# Patient Record
Sex: Female | Born: 1970 | Race: White | Hispanic: No | Smoking: Never smoker
Health system: Southern US, Community
[De-identification: ages and names within clinical notes are randomized; demographics above are authoritative.]

## PROBLEM LIST (undated history)

## (undated) DIAGNOSIS — N189 Chronic kidney disease, unspecified: Secondary | ICD-10-CM

## (undated) DIAGNOSIS — B029 Zoster without complications: Secondary | ICD-10-CM

## (undated) DIAGNOSIS — N83209 Unspecified ovarian cyst, unspecified side: Secondary | ICD-10-CM

## (undated) DIAGNOSIS — F32A Depression, unspecified: Secondary | ICD-10-CM

## (undated) DIAGNOSIS — F329 Major depressive disorder, single episode, unspecified: Secondary | ICD-10-CM

## (undated) DIAGNOSIS — IMO0002 Reserved for concepts with insufficient information to code with codable children: Secondary | ICD-10-CM

## (undated) DIAGNOSIS — T7840XA Allergy, unspecified, initial encounter: Secondary | ICD-10-CM

## (undated) DIAGNOSIS — I2699 Other pulmonary embolism without acute cor pulmonale: Secondary | ICD-10-CM

## (undated) DIAGNOSIS — F419 Anxiety disorder, unspecified: Secondary | ICD-10-CM

## (undated) DIAGNOSIS — G43709 Chronic migraine without aura, not intractable, without status migrainosus: Secondary | ICD-10-CM

## (undated) DIAGNOSIS — E039 Hypothyroidism, unspecified: Secondary | ICD-10-CM

## (undated) HISTORY — PX: WISDOM TOOTH EXTRACTION: SHX21

## (undated) HISTORY — DX: Unspecified ovarian cyst, unspecified side: N83.209

## (undated) HISTORY — DX: Other pulmonary embolism without acute cor pulmonale: I26.99

## (undated) HISTORY — PX: OTHER SURGICAL HISTORY: SHX169

## (undated) HISTORY — PX: BREAST SURGERY: SHX581

## (undated) HISTORY — DX: Depression, unspecified: F32.A

## (undated) HISTORY — DX: Major depressive disorder, single episode, unspecified: F32.9

## (undated) HISTORY — DX: Allergy, unspecified, initial encounter: T78.40XA

## (undated) HISTORY — PX: BREAST ENHANCEMENT SURGERY: SHX7

## (undated) HISTORY — DX: Zoster without complications: B02.9

## (undated) HISTORY — PX: FOOT SURGERY: SHX648

## (undated) HISTORY — DX: Anxiety disorder, unspecified: F41.9

---

## 1998-05-14 ENCOUNTER — Other Ambulatory Visit: Admission: RE | Admit: 1998-05-14 | Discharge: 1998-05-14 | Payer: Self-pay | Admitting: Obstetrics and Gynecology

## 1999-06-19 ENCOUNTER — Other Ambulatory Visit: Admission: RE | Admit: 1999-06-19 | Discharge: 1999-06-19 | Payer: Self-pay | Admitting: Internal Medicine

## 1999-06-25 ENCOUNTER — Emergency Department (HOSPITAL_COMMUNITY): Admission: EM | Admit: 1999-06-25 | Discharge: 1999-06-25 | Payer: Self-pay | Admitting: Emergency Medicine

## 2000-10-11 ENCOUNTER — Other Ambulatory Visit: Admission: RE | Admit: 2000-10-11 | Discharge: 2000-10-11 | Payer: Self-pay | Admitting: Obstetrics and Gynecology

## 2001-11-02 ENCOUNTER — Encounter: Payer: Self-pay | Admitting: Obstetrics and Gynecology

## 2001-11-02 ENCOUNTER — Ambulatory Visit (HOSPITAL_COMMUNITY): Admission: RE | Admit: 2001-11-02 | Discharge: 2001-11-02 | Payer: Self-pay | Admitting: Obstetrics and Gynecology

## 2002-04-18 ENCOUNTER — Inpatient Hospital Stay (HOSPITAL_COMMUNITY): Admission: AD | Admit: 2002-04-18 | Discharge: 2002-04-20 | Payer: Self-pay | Admitting: Obstetrics and Gynecology

## 2002-04-23 ENCOUNTER — Inpatient Hospital Stay (HOSPITAL_COMMUNITY): Admission: AD | Admit: 2002-04-23 | Discharge: 2002-04-23 | Payer: Self-pay | Admitting: Obstetrics and Gynecology

## 2002-04-23 ENCOUNTER — Encounter: Payer: Self-pay | Admitting: Obstetrics and Gynecology

## 2002-05-31 ENCOUNTER — Other Ambulatory Visit: Admission: RE | Admit: 2002-05-31 | Discharge: 2002-05-31 | Payer: Self-pay | Admitting: Obstetrics and Gynecology

## 2003-08-09 ENCOUNTER — Other Ambulatory Visit: Admission: RE | Admit: 2003-08-09 | Discharge: 2003-08-09 | Payer: Self-pay | Admitting: Obstetrics and Gynecology

## 2004-08-24 ENCOUNTER — Other Ambulatory Visit: Admission: RE | Admit: 2004-08-24 | Discharge: 2004-08-24 | Payer: Self-pay | Admitting: Obstetrics and Gynecology

## 2004-11-26 ENCOUNTER — Ambulatory Visit: Payer: Self-pay | Admitting: Internal Medicine

## 2005-02-22 ENCOUNTER — Ambulatory Visit: Payer: Self-pay | Admitting: Internal Medicine

## 2005-09-10 ENCOUNTER — Other Ambulatory Visit: Admission: RE | Admit: 2005-09-10 | Discharge: 2005-09-10 | Payer: Self-pay | Admitting: Obstetrics and Gynecology

## 2006-04-02 ENCOUNTER — Emergency Department (HOSPITAL_COMMUNITY): Admission: EM | Admit: 2006-04-02 | Discharge: 2006-04-02 | Payer: Self-pay | Admitting: Emergency Medicine

## 2006-07-06 ENCOUNTER — Ambulatory Visit: Payer: Self-pay | Admitting: Internal Medicine

## 2006-11-04 ENCOUNTER — Ambulatory Visit: Payer: Self-pay | Admitting: Internal Medicine

## 2007-02-16 ENCOUNTER — Ambulatory Visit: Payer: Self-pay | Admitting: Internal Medicine

## 2007-04-13 ENCOUNTER — Ambulatory Visit: Payer: Self-pay | Admitting: Internal Medicine

## 2007-09-22 ENCOUNTER — Telehealth: Payer: Self-pay | Admitting: Internal Medicine

## 2007-09-22 ENCOUNTER — Ambulatory Visit: Payer: Self-pay | Admitting: Internal Medicine

## 2008-04-08 ENCOUNTER — Telehealth: Payer: Self-pay | Admitting: *Deleted

## 2008-11-29 ENCOUNTER — Telehealth: Payer: Self-pay | Admitting: Internal Medicine

## 2008-11-30 ENCOUNTER — Ambulatory Visit: Payer: Self-pay | Admitting: Family Medicine

## 2008-11-30 DIAGNOSIS — J069 Acute upper respiratory infection, unspecified: Secondary | ICD-10-CM | POA: Insufficient documentation

## 2009-02-12 ENCOUNTER — Telehealth: Payer: Self-pay | Admitting: *Deleted

## 2009-02-12 ENCOUNTER — Telehealth: Payer: Self-pay | Admitting: Internal Medicine

## 2009-02-14 ENCOUNTER — Telehealth: Payer: Self-pay | Admitting: Internal Medicine

## 2009-02-18 ENCOUNTER — Telehealth: Payer: Self-pay | Admitting: Internal Medicine

## 2009-09-26 ENCOUNTER — Ambulatory Visit: Payer: Self-pay | Admitting: Family Medicine

## 2009-09-26 DIAGNOSIS — B029 Zoster without complications: Secondary | ICD-10-CM | POA: Insufficient documentation

## 2009-09-26 HISTORY — DX: Zoster without complications: B02.9

## 2009-09-29 ENCOUNTER — Telehealth: Payer: Self-pay | Admitting: *Deleted

## 2010-10-15 ENCOUNTER — Encounter: Payer: Self-pay | Admitting: Internal Medicine

## 2011-01-28 NOTE — Consult Note (Signed)
Summary: [facility]   Imported By: Lennie Odor 12/16/2010 15:01:23  _____________________________________________________________________  External Attachment:    Type:   Image     Comment:   External Document

## 2011-05-14 NOTE — H&P (Signed)
[facility]  Patient:    Desiree Bauer, Desiree Bauer Visit Number: [id number] MRN: [id number]          Service Type: MED Location: [facility] Attending Physician:  Cordelia Pen Ii Dictated by:   Duke Salvia. Marcelle Overlie, M.D. Admit Date:  04/23/2002                           History and Physical  HISTORY:                      A 40 year old G2, P75, EDD April 28.  She is 39 2/7 weeks.  Presents to the office today complaining of early labor.  Was 3, 80%, vertex.  Is admitted now in labor.  Group B Strep screen was negative. One hour GTT was 100.  Blood type B+.  Rubella titer is positive.  ALLERGIES:                    PENICILLIN, ERYTHROMYCIN, SULFA.  PAST OBSTETRICAL HISTORY:     One vaginal delivery in 1998 of 7 pound 10 ounce female.  REVIEW OF SYSTEMS:            Significant for a history of postpartum depression and migraine headache.  PHYSICAL EXAMINATION  VITAL SIGNS:                  Temperature 98.2, blood pressure 100/64.  HEENT:                        Unremarkable.  NECK:                         Supple without masses.  LUNGS:                        Clear.  CARDIOVASCULAR:               Regular rate and rhythm without murmurs, rubs, or gallops noted.  BREASTS:                      Not examined.  ABDOMEN:                      Term fundal height.  Fetal heart rate 140.  PELVIC:                       Cervix was 3, 80%, vertex, -1.  Membranes intact.  EXTREMITIES:                  Unremarkable.  NEUROLOGIC:                   Unremarkable.  IMPRESSION:                   Term intrauterine pregnancy, labor.  PLAN:                         Admit for AROM. Dictated by:   Duke Salvia. Marcelle Overlie, M.D. Attending Physician:  Soledad Gerlach DD:  04/18/02 TD:  04/18/02 Job: [phone] [id number]

## 2011-05-14 NOTE — Op Note (Signed)
[facility]  Patient:    Desiree Bauer, Desiree Bauer Visit Number: [id number] MRN: [id number]          Service Type: MED Location: [facility] Attending Physician:  Cordelia Pen Ii Dictated by:   Duke Salvia. Marcelle Overlie, M.D. Proc. Date: 04/18/02 Admit Date:  04/23/2002                             Operative Report  PREOPERATIVE DIAGNOSIS:  POSTOPERATIVE DIAGNOSIS:  OPERATION:  SURGEON:                      Duke Salvia. Marcelle Overlie, M.D.  ASSISTANT:  ANESTHESIA:  ESTIMATED BLOOD LOSS:  DESCRIPTION OF PROCEDURE:     This patient was noted to have a rapid progress and late first stage labor, increased bloody show, and was noted to be complete with some significant variable decellerations with good recovery. She was coached to push and with each pushing effort was noted to have deep variable with good recovery, but was fairly numb at that point and due to poor pushing effort, the decision was made to proceed with vacuum-assisted vaginal delivery.  At that point she was +3 and slightly LOA.  The Kiwi VE was applied.  One traction effort with maternal pushing to effect delivery of a female, Apgars 9 and 9.  There was a loose nuchal cord x1. Cord pH was sent and is pending.  A midline episiotomy with no extension.  The infant was doing well at that point.  The placenta delivered spontaneously intact.  There were no other lacerations noted.  Standard layered repair of the episiotomy with 3-0 Vicryl Rapide suture.  Mother and baby doing well at that point. Dictated by:   Duke Salvia. Marcelle Overlie, M.D. Attending Physician:  Soledad Gerlach DD:  04/18/02 TD:  04/18/02 Job: [phone] [id number]

## 2012-01-04 ENCOUNTER — Other Ambulatory Visit: Payer: Self-pay | Admitting: Obstetrics and Gynecology

## 2012-01-04 DIAGNOSIS — R928 Other abnormal and inconclusive findings on diagnostic imaging of breast: Secondary | ICD-10-CM

## 2012-01-11 ENCOUNTER — Ambulatory Visit
Admission: RE | Admit: 2012-01-11 | Discharge: 2012-01-11 | Disposition: A | Discharge status: Home / Self Care | Payer: 59 | Source: Ambulatory Visit | Attending: Obstetrics and Gynecology | Admitting: Obstetrics and Gynecology

## 2012-01-11 DIAGNOSIS — R928 Other abnormal and inconclusive findings on diagnostic imaging of breast: Secondary | ICD-10-CM

## 2013-05-19 ENCOUNTER — Ambulatory Visit (INDEPENDENT_AMBULATORY_CARE_PROVIDER_SITE_OTHER): Payer: 59 | Admitting: Emergency Medicine

## 2013-05-19 VITALS — BP 132/75 | HR 65 | Temp 98.0°F | Resp 16 | Ht 62.5 in | Wt 117.2 lb

## 2013-05-19 DIAGNOSIS — J309 Allergic rhinitis, unspecified: Secondary | ICD-10-CM

## 2013-05-19 DIAGNOSIS — N390 Urinary tract infection, site not specified: Secondary | ICD-10-CM

## 2013-05-19 DIAGNOSIS — N3000 Acute cystitis without hematuria: Secondary | ICD-10-CM

## 2013-05-19 LAB — POCT URINALYSIS DIPSTICK
Bilirubin, UA: NEGATIVE
Glucose, UA: NEGATIVE
Ketones, UA: NEGATIVE
Nitrite, UA: NEGATIVE
Protein, UA: NEGATIVE
Spec Grav, UA: <=1.005
Urobilinogen, UA: 0.2
pH, UA: 6.5

## 2013-05-19 LAB — POCT UA - MICROSCOPIC ONLY
Mucus, UA: NEGATIVE
RBC, urine, microscopic: NEGATIVE
Yeast, UA: NEGATIVE

## 2013-05-19 MED ORDER — CIPROFLOXACIN HCL 500 MG PO TABS: 500.0000 mg | ORAL_TABLET | Freq: Two times a day (BID) | ORAL | Status: DC

## 2013-05-19 MED ORDER — PHENAZOPYRIDINE HCL 200 MG PO TABS: 200.0000 mg | ORAL_TABLET | Freq: Three times a day (TID) | ORAL | Status: DC | PRN

## 2013-05-19 MED ORDER — FLUTICASONE PROPIONATE 50 MCG/ACT NA SUSP: 4.0000 | Freq: Every day | NASAL | Status: DC

## 2013-05-19 NOTE — Patient Instructions (Addendum)
Allergic Rhinitis Allergic rhinitis is when the mucous membranes in the nose respond to allergens. Allergens are particles in the air that cause your body to have an allergic reaction. This causes you to release allergic antibodies. Through a chain of events, these eventually cause you to release histamine into the blood stream (hence the use of antihistamines). Although meant to be protective to the body, it is this release that causes your discomfort, such as frequent sneezing, congestion and an itchy runny nose.  CAUSES  The pollen allergens may come from grasses, trees, and weeds. This is seasonal allergic rhinitis, or "hay fever." Other allergens cause year-round allergic rhinitis (perennial allergic rhinitis) such as house dust mite allergen, pet dander and mold spores.  SYMPTOMS   Nasal stuffiness (congestion).  Runny, itchy nose with sneezing and tearing of the eyes.  There is often an itching of the mouth, eyes and ears. It cannot be cured, but it can be controlled with medications. DIAGNOSIS  If you are unable to determine the offending allergen, skin or blood testing may find it. TREATMENT   Avoid the allergen.  Medications and allergy shots (immunotherapy) can help.  Hay fever may often be treated with antihistamines in pill or nasal spray forms. Antihistamines block the effects of histamine. There are over-the-counter medicines that may help with nasal congestion and swelling around the eyes. Check with your caregiver before taking or giving this medicine. If the treatment above does not work, there are many new medications your caregiver can prescribe. Stronger medications may be used if initial measures are ineffective. Desensitizing injections can be used if medications and avoidance fails. Desensitization is when a patient is given ongoing shots until the body becomes less sensitive to the allergen. Make sure you follow up with your caregiver if problems continue. SEEK MEDICAL  CARE IF:   You develop fever (more than 100.5 F (38.1 C).  You develop a cough that does not stop easily (persistent).  You have shortness of breath.  You start wheezing.  Symptoms interfere with normal daily activities. Document Released: 09/07/2001 Document Revised: 03/06/2012 Document Reviewed: 03/19/2009 [facility] Patient Information 2014 [place], [place]. Urinary Tract Infection Urinary tract infections (UTIs) can develop anywhere along your urinary tract. Your urinary tract is your body's drainage system for removing wastes and extra water. Your urinary tract includes two kidneys, two ureters, a bladder, and a urethra. Your kidneys are a pair of bean-shaped organs. Each kidney is about the size of your fist. They are located below your ribs, one on each side of your spine. CAUSES Infections are caused by microbes, which are microscopic organisms, including fungi, viruses, and bacteria. These organisms are so small that they can only be seen through a microscope. Bacteria are the microbes that most commonly cause UTIs. SYMPTOMS  Symptoms of UTIs may vary by age and gender of the patient and by the location of the infection. Symptoms in young women typically include a frequent and intense urge to urinate and a painful, burning feeling in the bladder or urethra during urination. Older women and men are more likely to be tired, shaky, and weak and have muscle aches and abdominal pain. A fever may mean the infection is in your kidneys. Other symptoms of a kidney infection include pain in your back or sides below the ribs, nausea, and vomiting. DIAGNOSIS To diagnose a UTI, your caregiver will ask you about your symptoms. Your caregiver also will ask to provide a urine sample. The urine sample will be  tested for bacteria and white blood cells. White blood cells are made by your body to help fight infection. TREATMENT  Typically, UTIs can be treated with medication. Because most UTIs are caused  by a bacterial infection, they usually can be treated with the use of antibiotics. The choice of antibiotic and length of treatment depend on your symptoms and the type of bacteria causing your infection. HOME CARE INSTRUCTIONS  If you were prescribed antibiotics, take them exactly as your caregiver instructs you. Finish the medication even if you feel better after you have only taken some of the medication.  Drink enough water and fluids to keep your urine clear or pale yellow.  Avoid caffeine, tea, and carbonated beverages. They tend to irritate your bladder.  Empty your bladder often. Avoid holding urine for long periods of time.  Empty your bladder before and after sexual intercourse.  After a bowel movement, women should cleanse from front to back. Use each tissue only once. SEEK MEDICAL CARE IF:   You have back pain.  You develop a fever.  Your symptoms do not begin to resolve within 3 days. SEEK IMMEDIATE MEDICAL CARE IF:   You have severe back pain or lower abdominal pain.  You develop chills.  You have nausea or vomiting.  You have continued burning or discomfort with urination. MAKE SURE YOU:   Understand these instructions.  Will watch your condition.  Will get help right away if you are not doing well or get worse. Document Released: 09/22/2005 Document Revised: 06/13/2012 Document Reviewed: 01/21/2012 [facility] Patient Information 2014 [place], [place].

## 2013-05-19 NOTE — Progress Notes (Signed)
[facility] [address], [place] [address] [phone]  Date:  05/19/2013   Name:  Desiree Bauer   DOB:  1971-05-24   MRN:  [id number]  PCP:  Lorretta Harp, MD    Chief Complaint: Cough and Urinary Tract Infection   History of Present Illness:  Desiree Bauer is a 42 y.o. very pleasant female patient who presents with the following:  History of prior UTI.  Monday developed some pressure in pelvis and by Wednesday was dysuria and urgency and frequency.  Started AZO with no improvement.  No fever or chills or nausea or vomiting.  NO GI or GYN symptoms.   Has nasal congestion and pressure largely clear watery nasal drainage and pressure.  Non productive cough.  No wheezing or shortness of breath.  No improvement with over the counter medications or other home remedies. Denies other complaint or health concern today.   Patient Active Problem List   Diagnosis Date Noted  . SHINGLES 09/26/2009  . URI 11/30/2008    Past Medical History  Diagnosis Date  . Allergy   . Depression   . Thyroid disease     Past Surgical History  Procedure Laterality Date  . Brain surgery      History  Substance Use Topics  . Smoking status: Never Smoker   . Smokeless tobacco: Not on file  . Alcohol Use: Not on file    Family History  Problem Relation Age of Onset  . Multiple sclerosis Mother   . Endometrial cancer Mother   . Thyroid disease Mother   . Thyroid disease Father   . Hypertension Father   . Stroke Maternal Grandmother     Allergies  Allergen Reactions  . Erythromycin     REACTION: nausea  . Penicillins   . Sulfonamide Derivatives     REACTION: dizziness    Medication list has been reviewed and updated.  No current outpatient prescriptions on file prior to visit.   No current facility-administered medications on file prior to visit.    Review of Systems:  As per HPI, otherwise negative.    Physical Examination: Filed Vitals:   05/19/13 1315  BP: 132/75  Pulse: 65  Temp: 98 F (36.7 C)  Resp: 16   Filed Vitals:   05/19/13 1315  Height: 5' 2.5" (1.588 m)  Weight: 117 lb 3.2 oz (53.162 kg)   Body mass index is 21.08 kg/(m^2). Ideal Body Weight: Weight in (lb) to have BMI = 25: 138.6  GEN: WDWN, NAD, Non-toxic, A & O x 3 HEENT: Atraumatic, Normocephalic. Neck supple. No masses, No LAD. Ears and Nose: No external deformity. CV: RRR, No M/G/R. No JVD. No thrill. No extra heart sounds. PULM: CTA B, no wheezes, crackles, rhonchi. No retractions. No resp. distress. No accessory muscle use. ABD: S, NT, ND, +BS. No rebound. No HSM. EXTR: No c/c/e NEURO Normal gait.  PSYCH: Normally interactive. Conversant. Not depressed or anxious appearing.  Calm demeanor.    Assessment and Plan: Acute cystitis SAR flonase cipro Pyridium   Signed,  Phillips Odor, MD   Results for orders placed in visit on 05/19/13  POCT URINALYSIS DIPSTICK      Result Value Range   Color, UA yellow     Clarity, UA clear     Glucose, UA neg     Bilirubin, UA neg     Ketones, UA neg     Spec Grav, UA <=1.005  Blood, UA moderate     pH, UA 6.5     Protein, UA neg     Urobilinogen, UA 0.2     Nitrite, UA neg     Leukocytes, UA large (3+)    POCT UA - MICROSCOPIC ONLY      Result Value Range   WBC, Ur, HPF, POC tntc     RBC, urine, microscopic neg     Bacteria, U Microscopic trace     Mucus, UA neg     Epithelial cells, urine per micros 0-2     Crystals, Ur, HPF, POC neg     Casts, Ur, LPF, POC neg     Yeast, UA neg

## 2013-05-20 ENCOUNTER — Telehealth: Payer: Self-pay

## 2013-05-20 MED ORDER — NITROFURANTOIN MONOHYD MACRO 100 MG PO CAPS: 100.0000 mg | ORAL_CAPSULE | Freq: Two times a day (BID) | ORAL | Status: DC

## 2013-05-20 NOTE — Telephone Encounter (Signed)
Any suggestions>?

## 2013-05-20 NOTE — Telephone Encounter (Signed)
I will change her from cipro to Macrobid. Have her take this with food, including yogurt. If the diarrhea persists she may need to be seen.

## 2013-05-20 NOTE — Telephone Encounter (Signed)
Spoke with pt, advised new Rx is at the pharmacy.

## 2013-05-20 NOTE — Telephone Encounter (Signed)
Patient is expericing severe diarrhea after taking the antibiotics  [id number]

## 2013-05-20 NOTE — Telephone Encounter (Signed)
Diarrhea is a common side effect of all antibiotics, however it can also be a sign of something else going on. How many loose stools is she having per day that we may need to have her come back in for evaluation. On the other hand it may be as simple as changing antibiotics. Please get more info about her diarrhea.

## 2013-05-20 NOTE — Telephone Encounter (Signed)
Spoke with pt, she is going every 15 mins. Last night from 11pm to 4am. Since 7 am she has gone 4-5 times. She feels fine otherwise.

## 2013-05-20 NOTE — Progress Notes (Signed)
Reviewed and agree.

## 2013-05-25 ENCOUNTER — Telehealth: Payer: Self-pay

## 2013-05-25 ENCOUNTER — Ambulatory Visit: Payer: 59 | Admitting: Emergency Medicine

## 2013-05-25 VITALS — BP 112/76 | HR 76 | Temp 98.3°F | Resp 16 | Ht 62.25 in | Wt 116.0 lb

## 2013-05-25 DIAGNOSIS — R300 Dysuria: Secondary | ICD-10-CM

## 2013-05-25 DIAGNOSIS — R197 Diarrhea, unspecified: Secondary | ICD-10-CM

## 2013-05-25 LAB — POCT UA - MICROSCOPIC ONLY
Casts, Ur, LPF, POC: NEGATIVE
Crystals, Ur, HPF, POC: NEGATIVE
Yeast, UA: NEGATIVE

## 2013-05-25 LAB — POCT URINALYSIS DIPSTICK
Bilirubin, UA: NEGATIVE
Leukocytes, UA: NEGATIVE
Nitrite, UA: NEGATIVE
Protein, UA: NEGATIVE
Urobilinogen, UA: 0.2
pH, UA: 6

## 2013-05-25 NOTE — Telephone Encounter (Signed)
Patient states she was seen for a UTI and had an allergic reaction to medication that was prescribed. Was given a new medication and now has diarrhea and still experiencing UTI symptoms. [id number]

## 2013-05-25 NOTE — Progress Notes (Signed)
[facility] [address], [place] [address] [phone]  Date:  05/25/2013   Name:  Desiree Bauer   DOB:  July 18, 1971   MRN:  [id number]  PCP:  Lorretta Harp, MD    Chief Complaint: Diarrhea   History of Present Illness:  Desiree Bauer is a 42 y.o. very pleasant female patient who presents with the following:  Here a week ago with acute cystitis and treated with cipro.  She nearly immediately developed what she calls an allergic reaction to the antibiotic and has frequent loose stools with no blood, mucous, or pus. No fever or chills.  Was changed from cipro to macrobid and thinks it has not improved significantly her dysuria and urgency.  No improvement with over the counter medications or other home remedies. Denies other complaint or health concern today.   Patient Active Problem List   Diagnosis Date Noted  . SHINGLES 09/26/2009  . URI 11/30/2008    Past Medical History  Diagnosis Date  . Allergy   . Depression   . Thyroid disease     Past Surgical History  Procedure Laterality Date  . Brain surgery      History  Substance Use Topics  . Smoking status: Never Smoker   . Smokeless tobacco: Not on file  . Alcohol Use: Not on file    Family History  Problem Relation Age of Onset  . Multiple sclerosis Mother   . Endometrial cancer Mother   . Thyroid disease Mother   . Thyroid disease Father   . Hypertension Father   . Stroke Maternal Grandmother     Allergies  Allergen Reactions  . Erythromycin     REACTION: nausea  . Penicillins   . Sulfonamide Derivatives     REACTION: dizziness    Medication list has been reviewed and updated.  Current Outpatient Prescriptions on File Prior to Visit  Medication Sig Dispense Refill  . ALPRAZolam (XANAX) 1 MG tablet Take 1 mg by mouth as needed for sleep.      . cholecalciferol (VITAMIN D) 1000 UNITS tablet Take 1,000 Units by mouth daily.      Marland Kitchen escitalopram (LEXAPRO) 10 MG tablet  Take 10 mg by mouth daily.      Marland Kitchen levothyroxine (SYNTHROID, LEVOTHROID) 100 MCG tablet Take 100 mcg by mouth daily.      . nitrofurantoin, macrocrystal-monohydrate, (MACROBID) 100 MG capsule Take 1 capsule (100 mg total) by mouth 2 (two) times daily.  14 capsule  0  . topiramate (TOPAMAX) 25 MG capsule Take 25 mg by mouth daily.      . fluticasone (FLONASE) 50 MCG/ACT nasal spray Place 4 sprays into the nose daily.  16 g  12  . phenazopyridine (PYRIDIUM) 200 MG tablet Take 1 tablet (200 mg total) by mouth 3 (three) times daily as needed for pain.  6 tablet  0   No current facility-administered medications on file prior to visit.    Review of Systems:  As per HPI, otherwise negative.    Physical Examination: Filed Vitals:   05/25/13 1240  BP: 112/76  Pulse: 76  Temp: 98.3 F (36.8 C)  Resp: 16   Filed Vitals:   05/25/13 1240  Height: 5' 2.25" (1.581 m)  Weight: 116 lb (52.617 kg)   Body mass index is 21.05 kg/(m^2). Ideal Body Weight: Weight in (lb) to have BMI = 25: 137.5  GEN: WDWN, NAD, Non-toxic, A & O x 3 HEENT: Atraumatic, Normocephalic.  Neck supple. No masses, No LAD. Ears and Nose: No external deformity. CV: RRR, No M/G/R. No JVD. No thrill. No extra heart sounds. PULM: CTA B, no wheezes, crackles, rhonchi. No retractions. No resp. distress. No accessory muscle use. ABD: S, NT, ND, +BS. No rebound. No HSM. EXTR: No c/c/e NEURO Normal gait.  PSYCH: Normally interactive. Conversant. Not depressed or anxious appearing.  Calm demeanor.    Assessment and Plan: Possible antibiotic associated diarrhea C dif Continue macrobid   Signed,  Phillips Odor, MD

## 2013-05-25 NOTE — Telephone Encounter (Signed)
Called patient advised her to return to clinic. She should be better with her UTI by now.

## 2014-02-07 ENCOUNTER — Other Ambulatory Visit: Payer: Self-pay | Admitting: Obstetrics and Gynecology

## 2014-05-07 ENCOUNTER — Encounter: Payer: Self-pay | Admitting: *Deleted

## 2014-05-07 ENCOUNTER — Encounter: Payer: Self-pay | Admitting: Internal Medicine

## 2014-05-15 ENCOUNTER — Telehealth: Payer: Self-pay | Admitting: Internal Medicine

## 2014-05-15 NOTE — Telephone Encounter (Signed)
Spoke with Jeannie at Dr. Vincente PoliGrewal and she states patient had an ultrasound at their office and has a ?mass in colon. Reports 6 lb weight gain since Saturday. Scheduled with Doug SouJessica Zehr, PA on 05/16/14 at 2:30 PM. She will fax OV notes and ultrasound results. She will notify patient of appointment.

## 2014-05-16 ENCOUNTER — Encounter: Payer: Self-pay | Admitting: Gastroenterology

## 2014-05-16 ENCOUNTER — Other Ambulatory Visit (INDEPENDENT_AMBULATORY_CARE_PROVIDER_SITE_OTHER): Payer: 59

## 2014-05-16 ENCOUNTER — Ambulatory Visit (INDEPENDENT_AMBULATORY_CARE_PROVIDER_SITE_OTHER): Payer: 59 | Admitting: Gastroenterology

## 2014-05-16 VITALS — BP 96/66 | HR 72 | Ht 62.25 in | Wt 122.4 lb

## 2014-05-16 DIAGNOSIS — R9389 Abnormal findings on diagnostic imaging of other specified body structures: Secondary | ICD-10-CM

## 2014-05-16 DIAGNOSIS — R109 Unspecified abdominal pain: Secondary | ICD-10-CM | POA: Insufficient documentation

## 2014-05-16 DIAGNOSIS — R195 Other fecal abnormalities: Secondary | ICD-10-CM | POA: Insufficient documentation

## 2014-05-16 DIAGNOSIS — R197 Diarrhea, unspecified: Secondary | ICD-10-CM | POA: Insufficient documentation

## 2014-05-16 LAB — IGA: IgA: 222 mg/dL (ref 68–378)

## 2014-05-16 MED ORDER — NA SULFATE-K SULFATE-MG SULF 17.5-3.13-1.6 GM/177ML PO SOLN: ORAL | Status: DC

## 2014-05-16 NOTE — Progress Notes (Signed)
05/16/2014 Desiree Bauer [id number] 27-May-1971   HISTORY OF PRESENT ILLNESS:  Patient is a 43 year old female who was referred to our office by her GYN, Dr. Vincente PoliGrewal, for evaluation regarding abdominal pain, heme positive stool, and an abnormal ultrasound showing possible stool in the colon vs a colonic mass.  She complains of abdominal pain since last Friday (almost a week ago) taht is in her lower abdomen/pelvis and radiates into her back.  Pain varies in intensity but has a steady ache.  Has been taking Vicodin for the pain apparently.  Says that she has gained 8 pounds in the past week, which she is very concerned about.  Also states that she has intermittent diarrhea for several years.  She reportedly had heme positive stools via Dr. Vincente PoliGrewal within the past couple weeks, but we do not have those results.  Ultrasound report shows a cyst on her left ovary and notes "? Bowel vs mass seen within CDS = 2.64 cm x 1.58 cm.  No blood flow seen within the mass".  Thyroid studies, CBC, CMP all WNL's in 01/2014.   Past Medical History  Diagnosis Date  . Allergy   . Depression   . Thyroid disease   . Anxiety    Past Surgical History  Procedure Laterality Date  . Brain surgery    . Breast enhancement surgery    . Foot surgery Bilateral     for plantar faciitis  . Uterine ablation    . Mortens neuroma removed      reports that she has quit smoking. She has never used smokeless tobacco. She reports that she drinks alcohol. She reports that she does not use illicit drugs. family history includes Diabetes in her paternal uncle; Endometrial cancer in her mother; Hypertension in her father; Multiple sclerosis in her mother; Stroke in her maternal grandmother; Thyroid disease in her father and mother. There is no history of Colon cancer, Throat cancer, Heart disease, Liver disease, or Kidney disease. Allergies  Allergen Reactions  . Ampicillin   . Erythromycin     REACTION: nausea  . Oxycodone     "too  strong"  . Penicillins   . Sulfonamide Derivatives     REACTION: dizziness      Outpatient Encounter Prescriptions as of 05/16/2014  Medication Sig  . butalbital-acetaminophen-caffeine (FIORICET) 50-325-40 MG per tablet Take 1-2 tablets as needed for pain  . escitalopram (LEXAPRO) 20 MG tablet Take 20 mg by mouth daily.  Marland Kitchen. HYDROcodone-acetaminophen (NORCO) 7.5-325 MG per tablet Take 1 tablet by mouth every 4 (four) hours as needed for moderate pain.  . norgestrel-ethinyl estradiol (CRYSELLE-28) 0.3-30 MG-MCG tablet Take 1 tablet by mouth daily.  Marland Kitchen. ALPRAZolam (XANAX) 1 MG tablet Take 1 mg by mouth as needed for sleep.  . cholecalciferol (VITAMIN D) 1000 UNITS tablet Take 1,000 Units by mouth daily.  Marland Kitchen. levothyroxine (SYNTHROID, LEVOTHROID) 100 MCG tablet Take 100 mcg by mouth daily.  . Na Sulfate-K Sulfate-Mg Sulf (SUPREP BOWEL PREP) SOLN USE PER PREP INSTRUCTIONS  . phenazopyridine (PYRIDIUM) 200 MG tablet Take 1 tablet (200 mg total) by mouth 3 (three) times daily as needed for pain.  Marland Kitchen. topiramate (TOPAMAX) 25 MG capsule Take 25 mg by mouth daily.  . [DISCONTINUED] escitalopram (LEXAPRO) 10 MG tablet Take 10 mg by mouth daily.  . [DISCONTINUED] fluticasone (FLONASE) 50 MCG/ACT nasal spray Place 4 sprays into the nose daily.  . [DISCONTINUED] nitrofurantoin, macrocrystal-monohydrate, (MACROBID) 100 MG capsule Take 1 capsule (100 mg total) by  mouth 2 (two) times daily.     REVIEW OF SYSTEMS  : All other systems reviewed and negative except where noted in the History of Present Illness.   PHYSICAL EXAM: BP 96/66  Pulse 72  Ht 5' 2.25" (1.581 m)  Wt 122 lb 6.4 oz (55.52 kg)  BMI 22.21 kg/m2 General: Well developed white female in no acute distress; non-toxic appearing. Head: Normocephalic and atraumatic Eyes:  Sclerae anicteric, conjunctiva pink. Ears: Normal auditory acuity  Lungs: Clear throughout to auscultation Heart: Regular rate and rhythm Abdomen: Soft, non-distended.   Normal bowel sounds.  Diffuse TTP, but abdomen benign. Rectal:  Will be done at the time of colonoscopy next week. Musculoskeletal: Symmetrical with no gross deformities  Skin: No lesions on visible extremities Extremities: No edema  Neurological: Alert oriented x 4, grossly non-focal Psychological:  Alert and cooperative. Normal mood and affect  ASSESSMENT AND PLAN: -Abdominal pain and heme positive stool with abnormal ultrasound imaging showing possible stool in the colon vs colonic mass.  Also complains of weight gain.  Abdomen benign on exam and no evidence of (or reason for) fluid accumulation.  Will order CT scan of the abdomen and pelvis with contrast at the patient/family request. -Diarrhea:  Somewhat chronic, intermittent.  Will check labs to rule out celiac disease.  *Will schedule colonoscopy for further evaluation.  The risks, benefits, and alternatives were discussed with the patient and shes consents to proceed.

## 2014-05-16 NOTE — Patient Instructions (Addendum)
You have been scheduled for a colonoscopy with propofol with Dr. Olevia Perches. Please follow written instructions given to you at your visit today.  Please pick up your prep kit at the pharmacy within the next 1-3 days. If you use inhalers (even only as needed), please bring them with you on the day of your procedure. Your physician has requested that you go to www.startemmi.com and enter the access code given to you at your visit today. This web site gives a general overview about your procedure. However, you should still follow specific instructions given to you by our office regarding your preparation for the procedure..  Your physician has requested that you go to the basement for the following lab work before leaving today: TTG IGA _____________________________________________________________________________________________________________________________________  Desiree Bauer have been scheduled for a CT scan of the abdomen and pelvis at [facility] ([address]---this is in the same building as Press photographer).   You are scheduled on 05-17-2014 at 830 am. You should arrive 15 minutes prior to your appointment time for registration. Please follow the written instructions below on the day of your exam:  WARNING: IF YOU ARE ALLERGIC TO IODINE/X-RAY DYE, PLEASE NOTIFY RADIOLOGY IMMEDIATELY AT [phone]! YOU WILL BE GIVEN A 13 HOUR PREMEDICATION PREP.  1) Do not eat or drink anything after 430 am (4 hours prior to your test) 2) You have been given 2 bottles of oral contrast to drink. The solution may taste better if refrigerated, but do NOT add ice or any other liquid to this solution. Shake well before drinking.    Drink 1 bottle of contrast @ 630 am (2 hours prior to your exam)  Drink 1 bottle of contrast @ 730 am (1 hour prior to your exam)  You may take any medications as prescribed with a small amount of water except for the following: Metformin, Glucophage, Glucovance,  Avandamet, Riomet, Fortamet, Actoplus Met, Janumet, Glumetza or Metaglip. The above medications must be held the day of the exam AND 48 hours after the exam.  The purpose of you drinking the oral contrast is to aid in the visualization of your intestinal tract. The contrast solution may cause some diarrhea. Before your exam is started, you will be given a small amount of fluid to drink. Depending on your individual set of symptoms, you may also receive an intravenous injection of x-ray contrast/dye. Plan on being at [facility] for 30 minutes or long, depending on the type of exam you are having performed.  This test typically takes 30-45 minutes to complete.  If you have any questions regarding your exam or if you need to reschedule, you may call the CT department at [phone] between the hours of 8:00 am and 5:00 pm, Monday-Friday.  ________________________________________________________________________

## 2014-05-17 ENCOUNTER — Ambulatory Visit (INDEPENDENT_AMBULATORY_CARE_PROVIDER_SITE_OTHER)
Admission: RE | Admit: 2014-05-17 | Discharge: 2014-05-17 | Disposition: A | Discharge status: Home / Self Care | Payer: 59 | Source: Ambulatory Visit | Attending: Gastroenterology | Admitting: Gastroenterology

## 2014-05-17 DIAGNOSIS — R9389 Abnormal findings on diagnostic imaging of other specified body structures: Secondary | ICD-10-CM

## 2014-05-17 DIAGNOSIS — R109 Unspecified abdominal pain: Secondary | ICD-10-CM

## 2014-05-17 DIAGNOSIS — R195 Other fecal abnormalities: Secondary | ICD-10-CM

## 2014-05-17 LAB — T-TRANSGLUTAMINASE (TTG) IGG: Tissue Transglut Ab: 2 U/mL (ref 0–5)

## 2014-05-17 MED ORDER — IOHEXOL 300 MG/ML  SOLN
100.0000 mL | Freq: Once | INTRAMUSCULAR | Status: AC | PRN
  Administered 2014-05-17: 100 mL via INTRAVENOUS

## 2014-05-21 ENCOUNTER — Encounter: Payer: Self-pay | Admitting: Gastroenterology

## 2014-05-22 ENCOUNTER — Encounter: Payer: Self-pay | Admitting: Internal Medicine

## 2014-05-22 ENCOUNTER — Ambulatory Visit (AMBULATORY_SURGERY_CENTER): Payer: 59 | Admitting: Internal Medicine

## 2014-05-22 VITALS — BP 96/51 | HR 59 | Temp 99.3°F | Resp 17 | Ht 62.0 in | Wt 122.0 lb

## 2014-05-22 DIAGNOSIS — R9389 Abnormal findings on diagnostic imaging of other specified body structures: Secondary | ICD-10-CM

## 2014-05-22 DIAGNOSIS — R1032 Left lower quadrant pain: Secondary | ICD-10-CM

## 2014-05-22 MED ORDER — SODIUM CHLORIDE 0.9 % IV SOLN: 500.0000 mL | INTRAVENOUS | Status: DC

## 2014-05-22 MED ORDER — LINACLOTIDE 145 MCG PO CAPS: 145.0000 ug | ORAL_CAPSULE | Freq: Every day | ORAL | Status: DC

## 2014-05-22 NOTE — Progress Notes (Signed)
Infected toe, broken at cuticle, has abx. But hasn't started yet.

## 2014-05-22 NOTE — Progress Notes (Signed)
A/ox3, pleased with MAC, report to RN 

## 2014-05-22 NOTE — Op Note (Signed)
[facility] [address] [place], [address]   COLONOSCOPY PROCEDURE REPORT  PATIENT: Desiree Bauer, Desiree Bauer  MR#: [id number] BIRTHDATE: 01/20/1971 , 42  yrs. old GENDER: Female ENDOSCOPIST: Hart Carwin, MD REFERRED JK:QASUO Lonie Peak, M.D. ,Dr Judie Petit.Grewal PROCEDURE DATE:  05/22/2014 PROCEDURE:   Colonoscopy, diagnostic First Screening Colonoscopy - Avg.  risk and is 50 yrs.  old or older - No.  Prior Negative Screening - Now for repeat screening. N/A  History of Adenoma - Now for follow-up colonoscopy & has been > or = to 3 yrs.  N/A  Polyps Removed Today? No.  Recommend repeat exam, <10 yrs? No. ASA CLASS:   Class I INDICATIONS:abdominal pain in the lower left quadrant, an abnormal CT, and heme-positive stool. MEDICATIONS: MAC sedation, administered by CRNA and Propofol (Diprivan) 240 mg IV  DESCRIPTION OF PROCEDURE:   After the risks benefits and alternatives of the procedure were thoroughly explained, informed consent was obtained.  A digital rectal exam revealed no abnormalities of the rectum.   The LB [id number]-[id number]  endoscope was introduced through the anus and advanced to the cecum, which was identified by both the appendix and ileocecal valve. No adverse events experienced.   The quality of the prep was excellent, using MoviPrep  The instrument was then slowly withdrawn as the colon was fully examined.      COLON FINDINGS: Small internal hemorrhoids were found.  Retroflexed views revealed no abnormalities. The time to cecum=3 minutes 59 seconds.  Withdrawal time=6 minutes 10 seconds.  The scope was withdrawn and the procedure completed. COMPLICATIONS: There were no complications.  ENDOSCOPIC IMPRESSION: Small internal hemorrhoids normal left colon with the exception of several shallow diverticula in the sigmoid colon patient's symptoms have been relieved of by taking the prep RECOMMENDATIONS: high-fiber diet Trial of probiotics for  constipation Trial ofLinzess 145 ug qd for chronic constipation   eSigned:  Hart Carwin, MD 05/22/2014 12:00 PM   cc:   PATIENT NAME:  Desiree Bauer, Desiree Bauer MR#: [id number]

## 2014-05-22 NOTE — Patient Instructions (Signed)
YOU HAD AN ENDOSCOPIC PROCEDURE TODAY AT THE [facility]: Refer to the procedure report that was given to you for any specific questions about what was found during the examination.  If the procedure report does not answer your questions, please call your gastroenterologist to clarify.  If you requested that your care partner not be given the details of your procedure findings, then the procedure report has been included in a sealed envelope for you to review at your convenience later.  YOU SHOULD EXPECT: Some feelings of bloating in the abdomen. Passage of more gas than usual.  Walking can help get rid of the air that was put into your GI tract during the procedure and reduce the bloating. If you had a lower endoscopy (such as a colonoscopy or flexible sigmoidoscopy) you may notice spotting of blood in your stool or on the toilet paper. If you underwent a bowel prep for your procedure, then you may not have a normal bowel movement for a few days.  DIET: Your first meal following the procedure should be a light meal and then it is ok to progress to your normal diet.  A half-sandwich or bowl of soup is an example of a good first meal.  Heavy or fried foods are harder to digest and may make you feel nauseous or bloated.  Likewise meals heavy in dairy and vegetables can cause extra gas to form and this can also increase the bloating.  Drink plenty of fluids but you should avoid alcoholic beverages for 24 hours.  ACTIVITY: Your care partner should take you home directly after the procedure.  You should plan to take it easy, moving slowly for the rest of the day.  You can resume normal activity the day after the procedure however you should NOT DRIVE or use heavy machinery for 24 hours (because of the sedation medicines used during the test).    SYMPTOMS TO REPORT IMMEDIATELY: A gastroenterologist can be reached at any hour.  During normal business hours, 8:30 AM to 5:00 PM Monday through Friday,  call [phone].  After hours and on weekends, please call the GI answering service at [phone] who will take a message and have the physician on call contact you.   Following lower endoscopy (colonoscopy or flexible sigmoidoscopy):  Excessive amounts of blood in the stool  Significant tenderness or worsening of abdominal pains  Swelling of the abdomen that is new, acute   Fever of 100 degrees or higher.   FOLLOW UP: If any biopsies were taken you will be contacted by phone or by letter within the next 1-3 weeks.  Call your gastroenterologist if you have not heard about the biopsies in 3 weeks.  Our staff will call the home number listed on your records the next business day following your procedure to check on you and address any questions or concerns that you may have at that time regarding the information given to you following your procedure. This is a courtesy call and so if there is no answer at the home number and we have not heard from you through the emergency physician on call, we will assume that you have returned to your regular daily activities without incident.  SIGNATURES/CONFIDENTIALITY: You and/or your care partner have signed paperwork which will be entered into your electronic medical record.  These signatures attest to the fact that that the information above on your After Visit Summary has been reviewed and is understood.  Full responsibility of the  confidentiality of this discharge information lies with you and/or your care-partner.  High fiber diet.  Diverticulosis and hemorrhoid information.    Trial of probiotics for constipation.      Linzess 145 ug for chronic constipation.

## 2014-05-23 ENCOUNTER — Telehealth: Payer: Self-pay | Admitting: *Deleted

## 2014-05-23 ENCOUNTER — Encounter: Payer: 59 | Admitting: Gastroenterology

## 2014-05-23 NOTE — Progress Notes (Signed)
Reviewed,and agree.please see results of colonoscopy.

## 2014-05-23 NOTE — Telephone Encounter (Signed)
  Follow up Call-  Call back number 05/22/2014  Post procedure Call Back phone  # [phone]  Permission to leave phone message Yes     Patient questions:  Do you have a fever, pain , or abdominal swelling? no Pain Score  0 *  Have you tolerated food without any problems? yes  Have you been able to return to your normal activities? yes  Do you have any questions about your discharge instructions: Diet   no Medications  no Follow up visit  no  Do you have questions or concerns about your Care? no  Actions: * If pain score is 4 or above: No action needed, pain <4.

## 2014-05-30 ENCOUNTER — Telehealth: Payer: Self-pay | Admitting: Internal Medicine

## 2014-05-31 NOTE — Telephone Encounter (Signed)
Patient called 3rd floor this morning to speak with someone about continued pain and tenderness at the IV site established for her colonoscopy last Wednesday, May 27th.  She experienced discomfort with the IV from the start and it did not improve.  She had been on an antibiotic for another problem but finished it Wednesday of this week.  The IV site started feeling worse after she finished the antibiotic with more tenderness and the area felt "cord like".  She tried using some warm compresses to the site and has taken Ibuprofen.  I asked that be seen one of the GI extenders for evaluation.  Ursula Beath to make appointment and call pt with time and date.

## 2014-06-03 NOTE — Telephone Encounter (Signed)
ok 

## 2014-06-05 ENCOUNTER — Ambulatory Visit (INDEPENDENT_AMBULATORY_CARE_PROVIDER_SITE_OTHER): Payer: 59 | Admitting: Physician Assistant

## 2014-06-05 ENCOUNTER — Encounter: Payer: Self-pay | Admitting: Physician Assistant

## 2014-06-05 VITALS — BP 90/58 | HR 72 | Ht 62.0 in | Wt 120.6 lb

## 2014-06-05 DIAGNOSIS — K5730 Diverticulosis of large intestine without perforation or abscess without bleeding: Secondary | ICD-10-CM

## 2014-06-05 DIAGNOSIS — M7989 Other specified soft tissue disorders: Secondary | ICD-10-CM

## 2014-06-05 DIAGNOSIS — I809 Phlebitis and thrombophlebitis of unspecified site: Secondary | ICD-10-CM

## 2014-06-05 NOTE — Progress Notes (Signed)
Subjective:    Patient ID: Desiree Bauer, female    DOB: 1971-12-23, 43 y.o.   MRN: [id number]  HPI  Desiree Bauer is a pleasant 43 year old white female known to Dr. Lina Sar who has just undergone a colonoscopy on 05/22/2014. She was found to have mild diverticulosis and internal hemorrhoids. She comes in today for evaluation of right upper extremity pain at her IV site. She says she had no immediate pain after the procedure but several days later noticed a tender swollen vein in the right antecubital area which has remained painful. Over the past couple days she's also having  some aching into her right upper arm. No fever or redness at the site. She had called the office and used warm compressesfor about 24 hours and has also been using ibuprofen on a daily basis. She says she's concerned now because it doesn't seem to be going away and because she is having some aching into the upper arm.    Review of Systems  Constitutional: Negative.   HENT: Negative.   Eyes: Negative.   Respiratory: Negative.   Cardiovascular: Negative.   Gastrointestinal: Negative.   Endocrine: Negative.   Genitourinary: Negative.   Musculoskeletal: Negative.   Skin: Negative.   Allergic/Immunologic: Negative.   Neurological: Negative.   Hematological: Negative.   Psychiatric/Behavioral: Negative.    Outpatient Prescriptions Prior to Visit  Medication Sig Dispense Refill  . ALPRAZolam (XANAX) 1 MG tablet Take 1 mg by mouth as needed for sleep.      . butalbital-acetaminophen-caffeine (FIORICET) 50-325-40 MG per tablet Take 1-2 tablets as needed for pain      . cholecalciferol (VITAMIN D) 1000 UNITS tablet Take 1,000 Units by mouth daily.      Marland Kitchen escitalopram (LEXAPRO) 20 MG tablet Take 20 mg by mouth daily.      Marland Kitchen levothyroxine (SYNTHROID, LEVOTHROID) 100 MCG tablet Take 100 mcg by mouth daily.      . norgestrel-ethinyl estradiol (CRYSELLE-28) 0.3-30 MG-MCG tablet As directed.      . topiramate (TOPAMAX) 25 MG  capsule Take 25 mg by mouth daily.      Marland Kitchen HYDROcodone-acetaminophen (NORCO) 7.5-325 MG per tablet Take 1 tablet by mouth every 4 (four) hours as needed for moderate pain.      . Linaclotide (LINZESS) 145 MCG CAPS capsule Take 1 capsule (145 mcg total) by mouth daily.  30 capsule  3   No facility-administered medications prior to visit.   Allergies  Allergen Reactions  . Ampicillin   . Erythromycin     REACTION: nausea  . Oxycodone     "too strong"  . Penicillins   . Sulfonamide Derivatives     REACTION: dizziness   Patient Active Problem List   Diagnosis Date Noted  . Diverticulosis of colon without hemorrhage 06/05/2014  . Heme positive stool 05/16/2014  . Abdominal pain, unspecified site 05/16/2014  . Diarrhea 05/16/2014  . SHINGLES 09/26/2009  . URI 11/30/2008   History  Substance Use Topics  . Smoking status: Former Games developer  . Smokeless tobacco: Never Used  . Alcohol Use: Yes     Comment: 1-2 drinks daily      Allergies  Allergen Reactions  . Ampicillin   . Erythromycin     REACTION: nausea  . Oxycodone     "too strong"  . Penicillins   . Sulfonamide Derivatives     REACTION: dizziness   Objective:   Physical Exam well-developed white female in no acute distress blood pressure  90/58 pulse 72 height 5 foot 2 weight 120. Exam of the right forearm shows no evidence of cellulitis or increased warmth ,no definite edema- she is tender with a firm superficial vein in the antecubital space and has tenderness into the right upper arm.        Assessment & Plan:  #481 43 year old female status post colonoscopy almost 2 weeks ago with right upper extremity pain at her procedural IV site .she appears to have a superficial phlebitis but with pain extending into the upper arm will rule out DVT   #2 diverticulosis   Plan ; Schedule patient for right upper extremity venous Doppler within the next 24 hours  She is asked to go back to warm compresses over the next couple of  days  She has been using a lot of ibuprofen,and is asked to  decrease the dose to 400 mg twice daily and also to take Pepcid twice daily over the next couple of weeks.  Further plans pending results of Doppler

## 2014-06-05 NOTE — Patient Instructions (Signed)
Take Ibuprofen 2 tab twice daily. Take pepcid ac 2 times daily.  We have scheduled the Venous doppler test at [facility]. Go to the [facility] entrance. They will then take you to the Heart and Vascular department. Date is tomorrow , Thursday, 06-06-2014. Arrive at 8:45 am.

## 2014-06-06 ENCOUNTER — Ambulatory Visit (HOSPITAL_COMMUNITY)
Admission: RE | Admit: 2014-06-06 | Discharge: 2014-06-06 | Disposition: A | Discharge status: Home / Self Care | Payer: 59 | Source: Ambulatory Visit | Attending: Internal Medicine | Admitting: Internal Medicine

## 2014-06-06 DIAGNOSIS — M7989 Other specified soft tissue disorders: Secondary | ICD-10-CM

## 2014-06-06 DIAGNOSIS — M79609 Pain in unspecified limb: Secondary | ICD-10-CM

## 2014-06-06 DIAGNOSIS — I809 Phlebitis and thrombophlebitis of unspecified site: Secondary | ICD-10-CM

## 2014-06-06 DIAGNOSIS — I82619 Acute embolism and thrombosis of superficial veins of unspecified upper extremity: Secondary | ICD-10-CM | POA: Insufficient documentation

## 2014-06-06 NOTE — Progress Notes (Signed)
*  PRELIMINARY RESULTS* Vascular Ultrasound Right upper extremity venous duplex has been completed.  Preliminary findings: No evidence of DVT in right arm. Superficial thrombus noted in right basilic vein at [facility].  Called results to Amy at [phone], left voice message. Let patient leave.  Farrel Demark, RDMS, RVT  06/06/2014, 9:48 AM

## 2014-06-09 ENCOUNTER — Encounter (HOSPITAL_COMMUNITY): Payer: Self-pay | Admitting: Emergency Medicine

## 2014-06-09 ENCOUNTER — Emergency Department (HOSPITAL_COMMUNITY): Payer: 59

## 2014-06-09 ENCOUNTER — Observation Stay (HOSPITAL_COMMUNITY)
Admission: EM | Admit: 2014-06-09 | Discharge: 2014-06-10 | Disposition: A | Discharge status: Home / Self Care | Payer: 59 | Attending: Internal Medicine | Admitting: Internal Medicine

## 2014-06-09 ENCOUNTER — Emergency Department (INDEPENDENT_AMBULATORY_CARE_PROVIDER_SITE_OTHER)
Admission: EM | Admit: 2014-06-09 | Discharge: 2014-06-09 | Disposition: A | Discharge status: Nursing Home (Includes ICF) | Payer: 59 | Source: Home / Self Care | Attending: Emergency Medicine | Admitting: Emergency Medicine

## 2014-06-09 DIAGNOSIS — I2699 Other pulmonary embolism without acute cor pulmonale: Secondary | ICD-10-CM

## 2014-06-09 DIAGNOSIS — F411 Generalized anxiety disorder: Secondary | ICD-10-CM

## 2014-06-09 DIAGNOSIS — F3289 Other specified depressive episodes: Secondary | ICD-10-CM | POA: Insufficient documentation

## 2014-06-09 DIAGNOSIS — E039 Hypothyroidism, unspecified: Secondary | ICD-10-CM | POA: Insufficient documentation

## 2014-06-09 DIAGNOSIS — R0781 Pleurodynia: Secondary | ICD-10-CM

## 2014-06-09 DIAGNOSIS — G43909 Migraine, unspecified, not intractable, without status migrainosus: Secondary | ICD-10-CM | POA: Insufficient documentation

## 2014-06-09 DIAGNOSIS — Z880 Allergy status to penicillin: Secondary | ICD-10-CM | POA: Insufficient documentation

## 2014-06-09 DIAGNOSIS — R0601 Orthopnea: Secondary | ICD-10-CM

## 2014-06-09 DIAGNOSIS — Z87891 Personal history of nicotine dependence: Secondary | ICD-10-CM | POA: Insufficient documentation

## 2014-06-09 DIAGNOSIS — F329 Major depressive disorder, single episode, unspecified: Secondary | ICD-10-CM | POA: Insufficient documentation

## 2014-06-09 DIAGNOSIS — R071 Chest pain on breathing: Secondary | ICD-10-CM

## 2014-06-09 HISTORY — DX: Hypothyroidism, unspecified: E03.9

## 2014-06-09 HISTORY — DX: Other pulmonary embolism without acute cor pulmonale: I26.99

## 2014-06-09 HISTORY — DX: Reserved for concepts with insufficient information to code with codable children: IMO0002

## 2014-06-09 HISTORY — DX: Chronic migraine without aura, not intractable, without status migrainosus: G43.709

## 2014-06-09 LAB — CBC
HCT: 39.4 % (ref 36.0–46.0)
Hemoglobin: 13.5 g/dL (ref 12.0–15.0)
MCH: 33.3 pg (ref 26.0–34.0)
MCHC: 34.3 g/dL (ref 30.0–36.0)
MCV: 97.3 fL (ref 78.0–100.0)
PLATELETS: 235 10*3/uL (ref 150–400)
RBC: 4.05 MIL/uL (ref 3.87–5.11)
RDW: 12.6 % (ref 11.5–15.5)
WBC: 7.7 10*3/uL (ref 4.0–10.5)

## 2014-06-09 LAB — BASIC METABOLIC PANEL
BUN: 12 mg/dL (ref 6–23)
CALCIUM: 9.2 mg/dL (ref 8.4–10.5)
CO2: 23 meq/L (ref 19–32)
CREATININE: 0.76 mg/dL (ref 0.50–1.10)
Chloride: 102 mEq/L (ref 96–112)
GFR calc Af Amer: >90 mL/min (ref >90)
GLUCOSE: 88 mg/dL (ref 70–99)
Potassium: 4.1 mEq/L (ref 3.7–5.3)
Sodium: 139 mEq/L (ref 137–147)

## 2014-06-09 LAB — I-STAT TROPONIN, ED: TROPONIN I, POC: 0 ng/mL (ref 0.00–0.08)

## 2014-06-09 LAB — PRO B NATRIURETIC PEPTIDE: Pro B Natriuretic peptide (BNP): 50.6 pg/mL (ref 0–125)

## 2014-06-09 LAB — POC URINE PREG, ED: Preg Test, Ur: NEGATIVE

## 2014-06-09 LAB — ANTITHROMBIN III: ANTITHROMB III FUNC: 108 % (ref 75–120)

## 2014-06-09 MED ORDER — ENOXAPARIN SODIUM 60 MG/0.6ML ~~LOC~~ SOLN
55.0000 mg | Freq: Once | SUBCUTANEOUS | Status: AC
  Administered 2014-06-09: 55 mg via SUBCUTANEOUS
  Filled 2014-06-09: qty 0.6

## 2014-06-09 MED ORDER — ACETAMINOPHEN 325 MG PO TABS
650.0000 mg | ORAL_TABLET | Freq: Once | ORAL | Status: AC
  Administered 2014-06-09: 650 mg via ORAL
  Filled 2014-06-09: qty 2

## 2014-06-09 MED ORDER — ONDANSETRON HCL 4 MG/2ML IJ SOLN: 4.0000 mg | Freq: Four times a day (QID) | INTRAMUSCULAR | Status: DC | PRN

## 2014-06-09 MED ORDER — ONDANSETRON HCL 4 MG/2ML IJ SOLN
INTRAMUSCULAR | Status: AC
  Administered 2014-06-09: 4 mg via INTRAVENOUS
  Filled 2014-06-09: qty 2

## 2014-06-09 MED ORDER — ALPRAZOLAM 0.5 MG PO TABS
1.0000 mg | ORAL_TABLET | Freq: Every evening | ORAL | Status: DC | PRN
  Administered 2014-06-09: 1 mg via ORAL
  Filled 2014-06-09: qty 2

## 2014-06-09 MED ORDER — IOHEXOL 300 MG/ML  SOLN: 100.0000 mL | Freq: Once | INTRAMUSCULAR | Status: DC | PRN

## 2014-06-09 MED ORDER — ONDANSETRON HCL 4 MG/2ML IJ SOLN
4.0000 mg | Freq: Once | INTRAMUSCULAR | Status: AC
  Administered 2014-06-09: 4 mg via INTRAVENOUS

## 2014-06-09 MED ORDER — ACETAMINOPHEN 325 MG PO TABS: 650.0000 mg | ORAL_TABLET | Freq: Four times a day (QID) | ORAL | Status: DC | PRN

## 2014-06-09 MED ORDER — ESCITALOPRAM OXALATE 20 MG PO TABS: 20.0000 mg | ORAL_TABLET | Freq: Every day | ORAL | Status: DC

## 2014-06-09 MED ORDER — SODIUM CHLORIDE 0.9 % IJ SOLN: 3.0000 mL | INTRAMUSCULAR | Status: DC | PRN

## 2014-06-09 MED ORDER — SODIUM CHLORIDE 0.9 % IJ SOLN
3.0000 mL | Freq: Two times a day (BID) | INTRAMUSCULAR | Status: DC
  Administered 2014-06-09: 3 mL via INTRAVENOUS

## 2014-06-09 MED ORDER — HYDROCODONE-ACETAMINOPHEN 5-325 MG PO TABS
2.0000 | ORAL_TABLET | Freq: Once | ORAL | Status: AC
  Administered 2014-06-09: 2 via ORAL
  Filled 2014-06-09: qty 2

## 2014-06-09 MED ORDER — ONDANSETRON HCL 4 MG PO TABS: 4.0000 mg | ORAL_TABLET | Freq: Four times a day (QID) | ORAL | Status: DC | PRN

## 2014-06-09 MED ORDER — ACETAMINOPHEN 650 MG RE SUPP: 650.0000 mg | Freq: Four times a day (QID) | RECTAL | Status: DC | PRN

## 2014-06-09 MED ORDER — TOPIRAMATE 25 MG PO TABS
25.0000 mg | ORAL_TABLET | Freq: Every day | ORAL | Status: DC
  Administered 2014-06-09: 25 mg via ORAL
  Filled 2014-06-09 (×2): qty 1

## 2014-06-09 MED ORDER — SODIUM CHLORIDE 0.9 % IJ SOLN: 3.0000 mL | Freq: Two times a day (BID) | INTRAMUSCULAR | Status: DC

## 2014-06-09 MED ORDER — TOPIRAMATE 25 MG PO CPSP: 25.0000 mg | ORAL_CAPSULE | Freq: Every day | ORAL | Status: DC

## 2014-06-09 MED ORDER — ENOXAPARIN SODIUM 60 MG/0.6ML ~~LOC~~ SOLN
55.0000 mg | Freq: Two times a day (BID) | SUBCUTANEOUS | Status: DC
  Filled 2014-06-09 (×4): qty 0.6

## 2014-06-09 MED ORDER — IOHEXOL 350 MG/ML SOLN
70.0000 mL | Freq: Once | INTRAVENOUS | Status: AC | PRN
  Administered 2014-06-09: 70 mL via INTRAVENOUS

## 2014-06-09 MED ORDER — ESCITALOPRAM OXALATE 20 MG PO TABS
20.0000 mg | ORAL_TABLET | Freq: Every day | ORAL | Status: DC
  Administered 2014-06-09: 20 mg via ORAL
  Filled 2014-06-09 (×3): qty 1

## 2014-06-09 MED ORDER — SODIUM CHLORIDE 0.9 % IV BOLUS (SEPSIS)
1000.0000 mL | Freq: Once | INTRAVENOUS | Status: AC
  Administered 2014-06-09: 1000 mL via INTRAVENOUS

## 2014-06-09 MED ORDER — HYDROCODONE-ACETAMINOPHEN 5-325 MG PO TABS
1.0000 | ORAL_TABLET | ORAL | Status: DC | PRN
  Administered 2014-06-09 – 2014-06-10 (×4): 2 via ORAL
  Filled 2014-06-09 (×4): qty 2

## 2014-06-09 MED ORDER — SODIUM CHLORIDE 0.9 % IV SOLN: 250.0000 mL | INTRAVENOUS | Status: DC | PRN

## 2014-06-09 MED ORDER — LEVOTHYROXINE SODIUM 100 MCG PO TABS
100.0000 ug | ORAL_TABLET | Freq: Every day | ORAL | Status: DC
  Administered 2014-06-10: 100 ug via ORAL
  Filled 2014-06-09 (×2): qty 1

## 2014-06-09 NOTE — ED Notes (Signed)
Felipa Etherrence, RN in with patient

## 2014-06-09 NOTE — ED Provider Notes (Signed)
Medical screening examination/treatment/procedure(s) were performed by non-physician practitioner and as supervising physician I was immediately available for consultation/collaboration.  Leslee Homeavid Tachina Spoonemore, M.D.  Reuben Likesavid C Sarin [place], MD 06/09/14 [phone]

## 2014-06-09 NOTE — ED Provider Notes (Signed)
CSN: [id number]     Arrival date & time 06/09/14  1206 History   First MD Initiated Contact with Patient 06/09/14 1500     Chief Complaint  Patient presents with  . Chest Pain     (Consider location/radiation/quality/duration/timing/severity/associated sxs/prior Treatment) Patient is a 43 y.o. female presenting with chest pain. The history is provided by the patient. No language interpreter was used.  Chest Pain Pain location:  R chest Pain quality: sharp   Pain radiates to:  Mid back Pain radiates to the back: yes   Pain severity:  Moderate Onset quality:  Unable to specify Duration:  1 week Timing:  Constant Progression:  Waxing and waning Chronicity:  New Context: at rest   Relieved by:  Nothing Worsened by:  Certain positions, exertion, movement, coughing and deep breathing Ineffective treatments:  Rest Associated symptoms: orthopnea and shortness of breath   Associated symptoms: no abdominal pain, no back pain, no claudication, no cough, no diaphoresis, no fatigue, no fever, no headache, no nausea, no near-syncope, no numbness, no palpitations, not vomiting and no weakness   Risk factors: birth control   Risk factors: no coronary artery disease, no diabetes mellitus, no Ehlers-Danlos syndrome, no high cholesterol, no hypertension, no immobilization, not female, no Marfan's syndrome, not obese, not pregnant, no prior DVT/PE, no smoking and no surgery     Past Medical History  Diagnosis Date  . Allergy   . Depression   . Thyroid disease   . Anxiety    Past Surgical History  Procedure Laterality Date  . Brain surgery    . Breast enhancement surgery    . Foot surgery Bilateral     for plantar faciitis  . Uterine ablation    . Mortens neuroma removed     Family History  Problem Relation Age of Onset  . Multiple sclerosis Mother   . Endometrial cancer Mother   . Thyroid disease Mother   . Thyroid disease Father   . Hypertension Father   . Stroke Maternal  Grandmother   . Colon cancer Neg Hx   . Throat cancer Neg Hx   . Diabetes Paternal Uncle   . Heart disease Neg Hx   . Liver disease Neg Hx   . Kidney disease Neg Hx    History  Substance Use Topics  . Smoking status: Former Games developermoker  . Smokeless tobacco: Never Used  . Alcohol Use: Yes     Comment: 1-2 drinks daily   OB History   Grav Para Term Preterm Abortions TAB SAB Ect Mult Living                 Review of Systems  Constitutional: Negative for fever, chills, diaphoresis, activity change, appetite change and fatigue.  HENT: Negative for congestion, facial swelling, rhinorrhea and sore throat.   Eyes: Negative for photophobia and discharge.  Respiratory: Positive for shortness of breath. Negative for cough and chest tightness.   Cardiovascular: Positive for chest pain and orthopnea. Negative for palpitations, claudication, leg swelling and near-syncope.  Gastrointestinal: Negative for nausea, vomiting, abdominal pain and diarrhea.  Endocrine: Negative for polydipsia and polyuria.  Genitourinary: Negative for dysuria, frequency, difficulty urinating and pelvic pain.  Musculoskeletal: Negative for arthralgias, back pain, neck pain and neck stiffness.  Skin: Negative for color change and wound.  Allergic/Immunologic: Negative for immunocompromised state.  Neurological: Negative for facial asymmetry, weakness, numbness and headaches.  Hematological: Does not bruise/bleed easily.  Psychiatric/Behavioral: Negative for confusion and agitation.  Allergies  Ampicillin; Erythromycin; Oxycodone; Penicillins; and Sulfonamide derivatives  Home Medications   Prior to Admission medications   Medication Sig Start Date End Date Taking? Authorizing Provider  ALPRAZolam Prudy Feeler(XANAX) 1 MG tablet Take 1 mg by mouth at bedtime as needed for sleep.    Yes Historical Provider, MD  butalbital-acetaminophen-caffeine (FIORICET) 50-325-40 MG per tablet Take 1-2 tablets as needed for pain   Yes  Historical Provider, MD  cholecalciferol (VITAMIN D) 1000 UNITS tablet Take 1,000 Units by mouth daily.   Yes Historical Provider, MD  diazepam (VALIUM) 2 MG tablet Take 2 mg by mouth every 8 (eight) hours as needed for anxiety.   Yes Historical Provider, MD  escitalopram (LEXAPRO) 20 MG tablet Take 20 mg by mouth daily.   Yes Historical Provider, MD  levothyroxine (SYNTHROID, LEVOTHROID) 100 MCG tablet Take 100 mcg by mouth daily.   Yes Historical Provider, MD  norgestrel-ethinyl estradiol (CRYSELLE-28) 0.3-30 MG-MCG tablet Take 1 tablet by mouth daily. As directed.   Yes Historical Provider, MD  topiramate (TOPAMAX) 25 MG capsule Take 25 mg by mouth daily.   Yes Historical Provider, MD   BP 112/74  Pulse 63  Temp(Src) 98.2 F (36.8 C) (Oral)  Resp 15  SpO2 100%  LMP 06/09/2014 Physical Exam  Constitutional: She is oriented to person, place, and time. She appears well-developed and well-nourished. No distress.  HENT:  Head: Normocephalic and atraumatic.  Mouth/Throat: No oropharyngeal exudate.  Eyes: Pupils are equal, round, and reactive to light.  Neck: Normal range of motion. Neck supple.  Cardiovascular: Normal rate, regular rhythm and normal heart sounds.  Exam reveals no gallop and no friction rub.   No murmur heard. Pulmonary/Chest: Effort normal and breath sounds normal. No respiratory distress. She has no wheezes. She has no rales.  Abdominal: Soft. Bowel sounds are normal. She exhibits no distension and no mass. There is no tenderness. There is no rebound and no guarding.  Musculoskeletal: Normal range of motion. She exhibits no edema and no tenderness.  Neurological: She is alert and oriented to person, place, and time.  Skin: Skin is warm and dry.  Psychiatric: She has a normal mood and affect.    ED Course  Procedures (including critical care time) Labs Review Labs Reviewed  CBC  BASIC METABOLIC PANEL  PRO B NATRIURETIC PEPTIDE  I-STAT TROPOININ, ED  POC URINE  PREG, ED    Imaging Review Dg Chest 2 View  06/09/2014   CLINICAL DATA:  Chest pain.  EXAM: CHEST  2 VIEW  COMPARISON:  None.  FINDINGS: The heart size and mediastinal contours are within normal limits. Both lungs are clear. The visualized skeletal structures are unremarkable.  IMPRESSION: No active cardiopulmonary disease.   Electronically Signed   By: Maisie Fushomas  Register   On: 06/09/2014 14:14   Ct Angio Chest Pe W/cm &/or Wo Cm  06/09/2014   CLINICAL DATA:  Right-sided chest pain, worse with breathing. Shortness of breath with exertion.  EXAM: CT ANGIOGRAPHY CHEST WITH CONTRAST  TECHNIQUE: Multidetector CT imaging of the chest was performed using the standard protocol during bolus administration of intravenous contrast. Multiplanar CT image reconstructions and MIPs were obtained to evaluate the vascular anatomy.  CONTRAST:  70mL OMNIPAQUE IOHEXOL 350 MG/ML SOLN  COMPARISON:  Chest x-ray earlier today.  FINDINGS: There are bilateral lower lobe pulmonary emboli within multiple lower lobe segmental branches. This is associated with ground-glass airspace opacity in the posterior right lung base, possibly early pulmonary infarct in. Left lung  is clear. No pleural effusions.  Heart is normal size. Aorta is normal caliber. No mediastinal, hilar, or axillary adenopathy. Chest wall soft tissues are unremarkable. Bilateral breast prostheses noted. Imaging into the upper abdomen shows no acute findings. Cystic area within the anterior liver, likely benign/simple cyst.  Review of the MIP images confirms the above findings.  IMPRESSION: Multiple bilateral lower lobe pulmonary emboli. Ground-glass opacity in the posterior right lung base likely reflects pulmonary infarct.  Critical Value/emergent results were called by telephone at the time of interpretation on 06/09/2014 at 5:07 PM to Dr. Ferdinand Lango, who verbally acknowledged these results.   Electronically Signed   By: Charlett Nose M.D.   On: 06/09/2014 17:08      EKG Interpretation   Date/Time:  Sunday June 09 2014 12:15:11 EDT Ventricular Rate:  69 PR Interval:  136 QRS Duration: 78 QT Interval:  414 QTC Calculation: 443 R Axis:   73 Text Interpretation:  Normal sinus rhythm TWI seen in V2 earlier today now  not seen Normal EKG Confirmed by DOCHERTY  MD, MEGAN ([id number]) on 06/09/2014  3:03:15 PM      MDM   Final diagnoses:  Bilateral pulmonary embolism    Pt is a 43 y.o. female with Pmhx as above who presents with about 1 week of R sided, sharp, pleuritic CP w/ radiation to back, orthopnea in setting of recent phlebitis of RUE and new OCP use. She has had a recently negative UE DVT study.  On PE< VSS, pt in NAD. Cardiopulm exam benign. No LE edema. Do not believe I can r/o PE w/ d-dimer. CTA chest ordered.   CTA + for multiple BLLL PE with likely pulm infarct in R lung base. Lovenox ordered. Pt undecided about tx w/ coumadin v Xarelto. [facility] consulted for admission.         Shanna Cisco, MD 06/09/14 1758

## 2014-06-09 NOTE — H&P (Signed)
History and Physical  Drue StagerLori B Buehl OZH:[id number] DOB: 29-Jan-1971 DOA: 06/09/2014  Referring physician: Dr. Micheline Mazeocherty in ED PCP: Lorretta HarpPANOSH,WANDA KOTVAN, MD   Chief Complaint: Chest pain  HPI:  43 year old woman presents to the emergency department with 6 day history of chest pain, predominantly right-sided. Imaging revealed bilateral lower lobe pulmonary emboli with suspected right pulmonary infarct. Nevertheless patient to be clinically stable and referred for observation.  Patient with no personal history of blood clots, recent travel. She is a nonsmoker. However approximately 3 weeks ago she was started on oral contraceptive pills for painful ovarian cysts. She recently underwent colonoscopy which was unremarkable but she developed a superficial thrombophlebitis, upper extremity Doppler was negative for DVT.  She developed chest pain 6/9 which is progressively worsening, remained constant, described as sharp in quality and with associated shortness of breath. Seems to be especially worse at night and has woken her up with trouble breathing. No specific alleviating factors. No swelling of the legs or pain in the legs.  In the emergency department afebrile, vital signs stable. No hypoxia. Basic metabolic panel, troponin, BNP, CBC unremarkable. Her pregnancy negative. EKG independently reviewed normal sinus rhythm with no acute changes. CT angiogram of the chest notable for multiple bilateral lower lobe pulmonary emboli and possible pulmonary infarct posterior right lung base.  Review of Systems:  Negative for fever, visual changes, sore throat, rash, new muscle aches, dysuria, bleeding, n/v/abdominal pain.  Past Medical History  Diagnosis Date  . Allergy   . Depression   . Hypothyroidism   . Anxiety   . Chronic migraine     Past Surgical History  Procedure Laterality Date  . Breast enhancement surgery    . Foot surgery Bilateral     for plantar faciitis  . Uterine ablation    .  Mortens neuroma removed      Social History:  reports that she has quit smoking. She has never used smokeless tobacco. She reports that she drinks alcohol. She reports that she does not use illicit drugs.  Allergies  Allergen Reactions  . Ampicillin   . Erythromycin     REACTION: nausea  . Oxycodone     "too strong"  . Penicillins   . Sulfonamide Derivatives     REACTION: dizziness    Family History  Problem Relation Age of Onset  . Multiple sclerosis Mother   . Endometrial cancer Mother   . Thyroid disease Mother   . Thyroid disease Father   . Hypertension Father   . Stroke Maternal Grandmother   . Colon cancer Neg Hx   . Throat cancer Neg Hx   . Diabetes Paternal Uncle   . Heart disease Neg Hx   . Liver disease Neg Hx   . Kidney disease Neg Hx      Prior to Admission medications   Medication Sig Start Date End Date Taking? Authorizing Provider  ALPRAZolam Prudy Feeler(XANAX) 1 MG tablet Take 1 mg by mouth at bedtime as needed for sleep.    Yes Historical Provider, MD  butalbital-acetaminophen-caffeine (FIORICET) 50-325-40 MG per tablet Take 1-2 tablets as needed for pain   Yes Historical Provider, MD  cholecalciferol (VITAMIN D) 1000 UNITS tablet Take 1,000 Units by mouth daily.   Yes Historical Provider, MD  diazepam (VALIUM) 2 MG tablet Take 2 mg by mouth every 8 (eight) hours as needed for anxiety.   Yes Historical Provider, MD  escitalopram (LEXAPRO) 20 MG tablet Take 20 mg by mouth daily.   Yes Historical  Provider, MD  levothyroxine (SYNTHROID, LEVOTHROID) 100 MCG tablet Take 100 mcg by mouth daily.   Yes Historical Provider, MD  norgestrel-ethinyl estradiol (CRYSELLE-28) 0.3-30 MG-MCG tablet Take 1 tablet by mouth daily. As directed.   Yes Historical Provider, MD  topiramate (TOPAMAX) 25 MG capsule Take 25 mg by mouth daily.   Yes Historical Provider, MD   Physical Exam: Filed Vitals:   06/09/14 1730 06/09/14 1800 06/09/14 1815 06/09/14 1820  BP: 112/74 115/82 115/73  115/73  Pulse: 63 66 74   Temp:      TempSrc:      Resp: 15 14 21 18   SpO2: 100% 100% 100% 100%    General: Examined in a MRSA department. Appears calm and comfortable Eyes: PERRL, normal lids, irises  ENT: grossly normal hearing, lips & tongue Neck: no LAD, masses or thyromegaly Cardiovascular: RRR, no m/r/g. No LE edema. Respiratory: CTA bilaterally, no w/r/r. Normal respiratory effort. Abdomen: soft, ntnd Skin: no rash or induration seen o Musculoskeletal: grossly normal tone BUE/BLE. No pain or swelling of the legs. Psychiatric: grossly normal mood and affect, speech fluent and appropriate Neurologic: grossly non-focal.  Wt Readings from Last 3 Encounters:  06/05/14 54.704 kg (120 lb 9.6 oz)  05/22/14 55.339 kg (122 lb)  05/16/14 55.52 kg (122 lb 6.4 oz)    Labs on Admission:  Basic Metabolic Panel:  Recent Labs Lab 06/09/14 1220  NA 139  K 4.1  CL 102  CO2 23  GLUCOSE 88  BUN 12  CREATININE 0.76  CALCIUM 9.2     CBC:  Recent Labs Lab 06/09/14 1220  WBC 7.7  HGB 13.5  HCT 39.4  MCV 97.3  PLT 235      Recent Labs  06/09/14 1234  TROPIPOC 0.00     Recent Labs  06/09/14 1220  PROBNP 50.6   Radiological Exams on Admission: Dg Chest 2 View  06/09/2014   CLINICAL DATA:  Chest pain.  EXAM: CHEST  2 VIEW  COMPARISON:  None.  FINDINGS: The heart size and mediastinal contours are within normal limits. Both lungs are clear. The visualized skeletal structures are unremarkable.  IMPRESSION: No active cardiopulmonary disease.   Electronically Signed   By: Maisie Fus  Register   On: 06/09/2014 14:14   Ct Angio Chest Pe W/cm &/or Wo Cm  06/09/2014   CLINICAL DATA:  Right-sided chest pain, worse with breathing. Shortness of breath with exertion.  EXAM: CT ANGIOGRAPHY CHEST WITH CONTRAST  TECHNIQUE: Multidetector CT imaging of the chest was performed using the standard protocol during bolus administration of intravenous contrast. Multiplanar CT image  reconstructions and MIPs were obtained to evaluate the vascular anatomy.  CONTRAST:  70mL OMNIPAQUE IOHEXOL 350 MG/ML SOLN  COMPARISON:  Chest x-ray earlier today.  FINDINGS: There are bilateral lower lobe pulmonary emboli within multiple lower lobe segmental branches. This is associated with ground-glass airspace opacity in the posterior right lung base, possibly early pulmonary infarct in. Left lung is clear. No pleural effusions.  Heart is normal size. Aorta is normal caliber. No mediastinal, hilar, or axillary adenopathy. Chest wall soft tissues are unremarkable. Bilateral breast prostheses noted. Imaging into the upper abdomen shows no acute findings. Cystic area within the anterior liver, likely benign/simple cyst.  Review of the MIP images confirms the above findings.  IMPRESSION: Multiple bilateral lower lobe pulmonary emboli. Ground-glass opacity in the posterior right lung base likely reflects pulmonary infarct.  Critical Value/emergent results were called by telephone at the time of interpretation on 06/09/2014  at 5:07 PM to Dr. Ferdinand LangoSam Jacobuwitz, who verbally acknowledged these results.   Electronically Signed   By: Charlett NoseKevin  Dover M.D.   On: 06/09/2014 17:08     Principal Problem:   Acute pulmonary embolism   Assessment/Plan 1. Acute bilateral pulmonary emboli with probable pulmonary infarct. Despite this the patient has no hypoxia, tachypnea or tachycardia and appears quite stable. Suspect secondary to recent initiation of oral contraceptive medication. Significance of the right superficial thrombophlebitis recently unclear but there is no evidence of DVT on examination previously. 2. Recent superficial thrombophlebitis right upper extremity. This appears resolved on examination with no residual erythema, edema or pain.   Plan admission to telemetry, observation, Lovenox tonight, obtain hypercoagulability panel if possible.  Check LE venous dopplers  Long discussion with the patient and her  husband, we discussed Coumadin/Lovenox versus newer anticoagulants, risk and benefit of these agents was outlined in detail. Plan is for nurse case manager to assess patient's benefits and approximate cost, she is currently leaning towards a novel anticoagulant.  Code Status: full code  DVT prophylaxis: Lovenox Family Communication:  Disposition Plan/Anticipated LOS: obs, 24 hours  Time spent: 60 minutes  Brendia Sacksaniel Goodrich, MD  [facility] Pager [phone] 06/09/2014, 6:40 PM

## 2014-06-09 NOTE — ED Notes (Signed)
Pt brought back to room with family in tow; pt getting undressed and into a gown at this time; Efraim KaufmannMelissa, RN aware

## 2014-06-09 NOTE — ED Notes (Signed)
Pt c/o "not feeling good" after administration of Lovenox injectrion; BP dropped to 53/systolic and pt lost color and breathing fast with nausea, no vomiting; Dr. Micheline Mazeocherty was called to room; Zofran 4 mg was given. Pt breathing slowed down and blood pressure increased to 98/58 and pulse rate 61.  Pt given 1000 ml of 0.9% NS. Pt relaxed with spouse at bedside. VS stable.

## 2014-06-09 NOTE — ED Notes (Signed)
Pt sent here from [facility]. Reports having colonoscopy done recently, developed phlebitis right arm after iv. Had doppler study and it was negative for blood clot but still had pain. Since approx 6/9 having right side chest pains that increase with breathing, sitting up and lying down. Sob with exertion. No resp distress noted at triage. ekg done.

## 2014-06-09 NOTE — ED Notes (Addendum)
Pt  Reports    Chest  Pain r  side   unreleived  By      Ice  And  meds         Pt reports   r  Sided    Arm  Pain into  r   Shoulder           Pt   Reports   Had         Complications   From     An iv  That  She  Had    In  Her  r  Arm  From  A  procedure        -  She  Had  Doppler of her  r  Arm  Recently  Which  Was  Neg  For  dvt   And  Reports  Now  The  Pain is  In  r  Side  Chest  And     r  Side    -  She   Ambulated  To  Room with a  Steady  Fluid  Gait  And  Is  Sitting  Upright on  Exam  Table     Good  r  Radial  Pulse

## 2014-06-09 NOTE — Consult Note (Signed)
ANTICOAGULATION CONSULT NOTE - Initial Consult  Pharmacy Consult for Lovenox Indication: pulmonary embolus  Allergies  Allergen Reactions  . Ampicillin   . Erythromycin     REACTION: nausea  . Oxycodone     "too strong"  . Penicillins   . Sulfonamide Derivatives     REACTION: dizziness    Patient Measurements: Height: 5\' 2"  (157.5 cm) Weight: 121 lb 11.1 oz (55.2 kg) IBW/kg (Calculated) : 50.1  Vital Signs: Temp: 98.5 F (36.9 C) (06/14 2022) Temp src: Oral (06/14 2022) BP: 104/73 mmHg (06/14 2022) Pulse Rate: 56 (06/14 2022)  Labs:  Recent Labs  06/09/14 1220  HGB 13.5  HCT 39.4  PLT 235  CREATININE 0.76    Estimated Creatinine Clearance: 71 ml/min (by C-G formula based on Cr of 0.76).   Medical History: Past Medical History  Diagnosis Date  . Allergy   . Depression   . Hypothyroidism   . Anxiety   . Chronic migraine    Assessment: 42yof presented to the ED with chest pain and SOB. CT angio shows multiple bilateral lower lobe pulmonary emboli. She will begin therapeutic lovenox. Received first dose in the ED. Renal function and CBC wnl. Weight = 55kg.  Goal of Therapy:  Anti-Xa level 0.6-1 units/ml 4hrs after LMWH dose given Monitor platelets by anticoagulation protocol: Yes   Plan:  1) Lovenox 55mg  sq q12 2) CBC q72 3) Follow up transition to oral AC  Fredrik RiggerMarkle, Nazier Neyhart Sue 06/09/2014,8:23 PM

## 2014-06-09 NOTE — ED Provider Notes (Signed)
CSN: [id number]     Arrival date & time 06/09/14  1041 History   First MD Initiated Contact with Patient 06/09/14 1059     Chief Complaint  Patient presents with  . Chest Pain   (Consider location/radiation/quality/duration/timing/severity/associated sxs/prior Treatment) HPI Comments: Patient reports that she underwent an elective colonoscopy 2 weeks ago and subsequently developed a thrombophlebitis at right forearm at IV site. States she followed up with GI provider regarding phlebitis on 06-04-2014 and was referred for RUE duplex U/S study on 06/06/2014 that was negative for DVT. [facility] stating that since 06-04-2014 she has also had right sided chest pain that is made worse with deep inspiration and bending over. States pain is constant and has intensified since it began. Also reports that over the past 48 hours she has significant shortness of breath when she tries to lie supine. States this has resulted in her having to sleep upright for the last two nights. Reports that her chest pain radiates to her right shoulder and her right flank. Denies fever, cough, pedal edema or hemoptysis.   Patient is a 43 y.o. female presenting with chest pain. The history is provided by the patient.  Chest Pain Associated symptoms: back pain and shortness of breath   Associated symptoms: no cough and no palpitations     Past Medical History  Diagnosis Date  . Allergy   . Depression   . Thyroid disease   . Anxiety    Past Surgical History  Procedure Laterality Date  . Brain surgery    . Breast enhancement surgery    . Foot surgery Bilateral     for plantar faciitis  . Uterine ablation    . Mortens neuroma removed     Family History  Problem Relation Age of Onset  . Multiple sclerosis Mother   . Endometrial cancer Mother   . Thyroid disease Mother   . Thyroid disease Father   . Hypertension Father   . Stroke Maternal Grandmother   . Colon cancer Neg Hx   . Throat cancer Neg Hx    . Diabetes Paternal Uncle   . Heart disease Neg Hx   . Liver disease Neg Hx   . Kidney disease Neg Hx    History  Substance Use Topics  . Smoking status: Former Games developermoker  . Smokeless tobacco: Never Used  . Alcohol Use: Yes     Comment: 1-2 drinks daily   OB History   Grav Para Term Preterm Abortions TAB SAB Ect Mult Living                 Review of Systems  Constitutional: Negative.   HENT: Negative.   Eyes: Negative.   Respiratory: Positive for chest tightness and shortness of breath. Negative for cough, wheezing and stridor.   Cardiovascular: Positive for chest pain. Negative for palpitations and leg swelling.  Gastrointestinal: Negative.   Genitourinary: Negative.   Musculoskeletal: Positive for back pain.  Skin: Negative.   Neurological: Negative.     Allergies  Ampicillin; Erythromycin; Oxycodone; Penicillins; and Sulfonamide derivatives  Home Medications   Prior to Admission medications   Medication Sig Start Date End Date Taking? Authorizing Provider  ALPRAZolam Prudy Feeler(XANAX) 1 MG tablet Take 1 mg by mouth as needed for sleep.    Historical Provider, MD  butalbital-acetaminophen-caffeine (FIORICET) 50-325-40 MG per tablet Take 1-2 tablets as needed for pain    Historical Provider, MD  cholecalciferol (VITAMIN D) 1000 UNITS tablet Take 1,000  Units by mouth daily.    Historical Provider, MD  escitalopram (LEXAPRO) 20 MG tablet Take 20 mg by mouth daily.    Historical Provider, MD  levothyroxine (SYNTHROID, LEVOTHROID) 100 MCG tablet Take 100 mcg by mouth daily.    Historical Provider, MD  norgestrel-ethinyl estradiol (CRYSELLE-28) 0.3-30 MG-MCG tablet As directed.    Historical Provider, MD  topiramate (TOPAMAX) 25 MG capsule Take 25 mg by mouth daily.    Historical Provider, MD   BP 106/64  Pulse 76  Temp(Src) 98.3 F (36.8 C) (Oral)  Resp 15  SpO2 100%  LMP 06/09/2014 Physical Exam  Nursing note and vitals reviewed. Constitutional: She is oriented to person,  place, and time. She appears well-developed and well-nourished. No distress.  HENT:  Head: Normocephalic and atraumatic.  Eyes: Conjunctivae are normal. No scleral icterus.  Neck: Normal range of motion. Neck supple. No JVD present.  Cardiovascular: Normal rate, regular rhythm and normal heart sounds.  Exam reveals no friction rub.   No murmur heard. Pulmonary/Chest: Effort normal and breath sounds normal. No respiratory distress.  Abdominal: Soft. Bowel sounds are normal. She exhibits no distension. There is no tenderness.  Musculoskeletal: Normal range of motion. She exhibits no edema and no tenderness.  Neurological: She is alert and oriented to person, place, and time.  Skin: Skin is warm and dry. No rash noted. No erythema.  Psychiatric: She has a normal mood and affect. Her behavior is normal.    ED Course  Procedures (including critical care time) Labs Review Labs Reviewed - No data to display  Imaging Review No results found.   MDM   1. Pleuritic chest pain   2. Orthopnea   ECG: NSR @ 64 bpm without ectopy or acute St/Twave changes Vital signs normal 43 y/o female non-smoker with no reported use of exogenous hormone therapy or hx of DVT/PE with 4 day history of right sided pleuritic chest pain with associated orthopnea. [facility] chest to investigate for PE.    Ardis RowanJennifer Lee Presson, PA 06/09/14 1210

## 2014-06-10 DIAGNOSIS — I2699 Other pulmonary embolism without acute cor pulmonale: Secondary | ICD-10-CM

## 2014-06-10 DIAGNOSIS — F411 Generalized anxiety disorder: Secondary | ICD-10-CM

## 2014-06-10 LAB — BETA-2-GLYCOPROTEIN I ABS, IGG/M/A
Beta-2 Glyco I IgG: 0 G Units (ref <20)
Beta-2-Glycoprotein I IgA: 13 A Units (ref <20)
Beta-2-Glycoprotein I IgM: 21 M Units — ABNORMAL HIGH (ref <20)

## 2014-06-10 LAB — LUPUS ANTICOAGULANT PANEL
DRVVT: 36 secs (ref <42.9)
Lupus Anticoagulant: NOT DETECTED
PTT Lupus Anticoagulant: 31.3 secs (ref 28.0–43.0)

## 2014-06-10 LAB — PROTEIN S ACTIVITY: PROTEIN S ACTIVITY: 111 % (ref 69–129)

## 2014-06-10 LAB — PROTEIN S, TOTAL: Protein S Ag, Total: 101 % (ref 60–150)

## 2014-06-10 LAB — CARDIOLIPIN ANTIBODIES, IGG, IGM, IGA
ANTICARDIOLIPIN IGA: 5 U/mL — AB (ref <22)
ANTICARDIOLIPIN IGM: 5 [MPL'U]/mL — AB (ref <11)
Anticardiolipin IgG: 6 GPL U/mL — ABNORMAL LOW (ref <23)

## 2014-06-10 LAB — PROTEIN C, TOTAL: Protein C, Total: 144 % (ref 72–160)

## 2014-06-10 LAB — HOMOCYSTEINE: Homocysteine: 9.5 umol/L (ref 4.0–15.4)

## 2014-06-10 LAB — PROTEIN C ACTIVITY: PROTEIN C ACTIVITY: 200 % — AB (ref 75–133)

## 2014-06-10 MED ORDER — IBUPROFEN 400 MG PO TABS: 400.0000 mg | ORAL_TABLET | ORAL | Status: DC | PRN

## 2014-06-10 MED ORDER — RIVAROXABAN 20 MG PO TABS: 20.0000 mg | ORAL_TABLET | Freq: Every day | ORAL | Status: DC

## 2014-06-10 MED ORDER — TRAMADOL HCL 50 MG PO TABS
50.0000 mg | ORAL_TABLET | Freq: Four times a day (QID) | ORAL | Status: DC | PRN
Start: 2014-06-10 — End: 2014-07-26

## 2014-06-10 MED ORDER — HYDROCODONE-ACETAMINOPHEN 5-325 MG PO TABS: 1.0000 | ORAL_TABLET | ORAL | Status: DC | PRN

## 2014-06-10 MED ORDER — RIVAROXABAN 15 MG PO TABS
15.0000 mg | ORAL_TABLET | Freq: Two times a day (BID) | ORAL | Status: DC
  Administered 2014-06-10 (×2): 15 mg via ORAL
  Filled 2014-06-10 (×3): qty 1

## 2014-06-10 MED ORDER — RIVAROXABAN 15 MG PO TABS: 15.0000 mg | ORAL_TABLET | Freq: Two times a day (BID) | ORAL | Status: DC

## 2014-06-10 MED ORDER — TRAMADOL HCL 50 MG PO TABS
50.0000 mg | ORAL_TABLET | Freq: Four times a day (QID) | ORAL | Status: DC | PRN
  Administered 2014-06-10: 50 mg via ORAL
  Filled 2014-06-10: qty 1

## 2014-06-10 NOTE — Care Management Note (Addendum)
    Page 1 of 1   06/10/2014     2:19:34 PM CARE MANAGEMENT NOTE 06/10/2014  Patient:  Desiree Bauer,Desiree Bauer   Account Number:  [id number]  Date Initiated:  06/10/2014  Documentation initiated by:  Yonah Tangeman  Subjective/Objective Assessment:   Pt adm on 6/14 with PE.  PTA, pt independent, lives with spouse.     Action/Plan:   Pt to dc on Xarelto.  Pt given copay card for Xarelto; she will be able to get med at no cost for one year with assist card.  She is appreciative of help.   Anticipated DC Date:  06/10/2014   Anticipated DC Plan:  HOME/SELF CARE      DC Planning Services  CM consult  Medication Assistance      Choice offered to / List presented to:             Status of service:  Completed, signed off Medicare Important Message given?   (If response is "NO", the following Medicare IM given date fields will be blank) Date Medicare IM given:   Date Additional Medicare IM given:    Discharge Disposition:  HOME/SELF CARE  Per UR Regulation:  Reviewed for med. necessity/level of care/duration of stay  If discussed at Long Length of Stay Meetings, dates discussed:    Comments:  XARELTO 20 MG DAILY  FOR 30 DAY SUPPLY  COVER : YES CO-PAY-$ 30.00 PRIOR APPROVAL NO PHARMACY: [facility] ** IF RX WILL  BE LONG TERM CAN ONLY GET TWO 30 DAY SUPPLY   AFTER CAN ONLY GET A 90 DAY SUPPLY **

## 2014-06-10 NOTE — Discharge Instructions (Signed)
Information on my medicine - XARELTO (rivaroxaban)  This medication education was reviewed with me or my healthcare representative as part of my discharge preparation.  The pharmacist that spoke with me during my hospital stay was:  Benny LennertMeyer, Ericha Whittingham David, [facility]  WHY WAS Carlena HurlXARELTO PRESCRIBED FOR YOU? Xarelto was prescribed to treat blood clots that may have been found in the veins of your legs (deep vein thrombosis) or in your lungs (pulmonary embolism) and to reduce the risk of them occurring again.  What do you need to know about Xarelto? The starting dose is one 15 mg tablet taken TWICE daily with food for the FIRST 21 DAYS then on (enter date)  07/01/14  the dose is changed to one 20 mg tablet taken ONCE A DAY with your evening meal.  DO NOT stop taking Xarelto without talking to the health care provider who prescribed the medication.  Refill your prescription for 20 mg tablets before you run out.  After discharge, you should have regular check-up appointments with your healthcare provider that is prescribing your Xarelto.  In the future your dose may need to be changed if your kidney function changes by a significant amount.  What do you do if you miss a dose? If you are taking Xarelto TWICE DAILY and you miss a dose, take it as soon as you remember. You may take two 15 mg tablets (total 30 mg) at the same time then resume your regularly scheduled 15 mg twice daily the next day.  If you are taking Xarelto ONCE DAILY and you miss a dose, take it as soon as you remember on the same day then continue your regularly scheduled once daily regimen the next day. Do not take two doses of Xarelto at the same time.   Important Safety Information Xarelto is a blood thinner medicine that can cause bleeding. You should call your healthcare provider right away if you experience any of the following:   Bleeding from an injury or your nose that does not stop.   Unusual colored urine (red or dark brown) or  unusual colored stools (red or black).   Unusual bruising for unknown reasons.   A serious fall or if you hit your head (even if there is no bleeding).  Some medicines may interact with Xarelto and might increase your risk of bleeding while on Xarelto. To help avoid this, consult your healthcare provider or pharmacist prior to using any new prescription or non-prescription medications, including herbals, vitamins, non-steroidal anti-inflammatory drugs (NSAIDs) and supplements.  This website has more information on Xarelto: [email or web address].xarelto.[email or web address].

## 2014-06-10 NOTE — Progress Notes (Signed)
ANTICOAGULATION CONSULT NOTE - Follow Up Consult  Pharmacy Consult for lovenox>> xarelto Indication: pulmonary embolus  Allergies  Allergen Reactions  . Ampicillin   . Erythromycin     REACTION: nausea  . Oxycodone     "too strong"  . Penicillins   . Sulfonamide Derivatives     REACTION: dizziness    Patient Measurements: Height: 5\' 2"  (157.5 cm) Weight: 121 lb 11.1 oz (55.2 kg) IBW/kg (Calculated) : 50.1  Vital Signs: Temp: 98.5 F (36.9 C) (06/15 0600) Temp src: Oral (06/15 0600) BP: 92/60 mmHg (06/15 0600) Pulse Rate: 55 (06/15 0600)  Labs:  Recent Labs  06/09/14 1220  HGB 13.5  HCT 39.4  PLT 235  CREATININE 0.76    Estimated Creatinine Clearance: 71 ml/min (by C-G formula based on Cr of 0.76).   Medications:  Scheduled:  . escitalopram  20 mg Oral QHS  . levothyroxine  100 mcg Oral QAC breakfast  . Rivaroxaban  15 mg Oral BID WC  . [START ON 07/01/2014] rivaroxaban  20 mg Oral Q supper  . sodium chloride  3 mL Intravenous Q12H  . sodium chloride  3 mL Intravenous Q12H  . topiramate  25 mg Oral QHS    Assessment: 43 yo female with PE on lovenox and was on lovenox and now to change to Xarelto.  SCr= 0.76, CrCl ~ 70, last lovenox given at 7pm on 6/14 and noted that patient has a possible reaction.   Goal of Therapy:  Monitor platelets by anticoagulation protocol: Yes   Plan:  -Xarelto 15mg  po bid for 21 days then 20mg  po daily to start on 07/01/14. -Will provide patient education  Harland Germanndrew John Williamsen, Pharm D 06/10/2014 10:02 AM

## 2014-06-10 NOTE — Progress Notes (Signed)
UR Completed.  Shalik Sanfilippo Jane [phone] 06/10/2014  

## 2014-06-10 NOTE — Discharge Summary (Signed)
Physician Discharge Summary  Desiree Bauer WGN:[id number] DOB: 07/21/1971 DOA: 06/09/2014  PCP: Lorretta Harp, MD  Admit date: 06/09/2014 Discharge date: 06/10/2014  Time spent: 35 minutes  Recommendations for Outpatient Follow-up:  1. xarelto for PE 2. NSAIDs for lung pain- given script for ultram  Discharge Diagnoses:  Principal Problem:   Acute pulmonary embolism Active Problems:   Anxiety state, unspecified   Embolism, pulmonary with infarction   Discharge Condition: improved  Diet recommendation: regular  Filed Weights   06/09/14 1922 06/09/14 2022  Weight: 54.7 kg (120 lb 9.5 oz) 55.2 kg (121 lb 11.1 oz)    History of present illness:  43 year old woman presents to the emergency department with 6 day history of chest pain, predominantly right-sided. Imaging revealed bilateral lower lobe pulmonary emboli with suspected right pulmonary infarct. Nevertheless patient to be clinically stable and referred for observation.  Patient with no personal history of blood clots, recent travel. She is a nonsmoker. However approximately 3 weeks ago she was started on oral contraceptive pills for painful ovarian cysts. She recently underwent colonoscopy which was unremarkable but she developed a superficial thrombophlebitis, upper extremity Doppler was negative for DVT.  She developed chest pain 6/9 which is progressively worsening, remained constant, described as sharp in quality and with associated shortness of breath. Seems to be especially worse at night and has woken her up with trouble breathing. No specific alleviating factors. No swelling of the legs or pain in the legs.  In the emergency department afebrile, vital signs stable. No hypoxia. Basic metabolic panel, troponin, BNP, CBC unremarkable. Her pregnancy negative. EKG independently reviewed normal sinus rhythm with no acute changes. CT angiogram of the chest notable for multiple bilateral lower lobe pulmonary emboli and  possible pulmonary infarct posterior right lung base.      Hospital Course:  PE- xarelto- 2 scripts given, not requiring O2  pulm infarction- pain meds given  Procedures:  Duplex- negative  Consultations: None   Discharge Exam: Filed Vitals:   06/10/14 1300  BP:   Pulse: 90  Temp:   Resp:     General: anxious appearing Cardiovascular: rrr Respiratory: clear anterior  Discharge Instructions You were cared for by a hospitalist during your hospital stay. If you have any questions about your discharge medications or the care you received while you were in the hospital after you are discharged, you can call the unit and asked to speak with the hospitalist on call if the hospitalist that took care of you is not available. Once you are discharged, your primary care physician will handle any further medical issues. Please note that NO REFILLS for any discharge medications will be authorized once you are discharged, as it is imperative that you return to your primary care physician (or establish a relationship with a primary care physician if you do not have one) for your aftercare needs so that they can reassess your need for medications and monitor your lab values.      Discharge Instructions   Diet general    Complete by:  As directed      Discharge instructions    Complete by:  As directed   Cbc, bmp 1 week     Increase activity slowly    Complete by:  As directed             Medication List    STOP taking these medications       CRYSELLE-28 0.3-30 MG-MCG tablet  Generic drug:  norgestrel-ethinyl estradiol  TAKE these medications       ALPRAZolam 1 MG tablet  Commonly known as:  XANAX  Take 1 mg by mouth at bedtime as needed for sleep.     cholecalciferol 1000 UNITS tablet  Commonly known as:  VITAMIN D  Take 1,000 Units by mouth daily.     diazepam 2 MG tablet  Commonly known as:  VALIUM  Take 2 mg by mouth every 8 (eight) hours as needed for  anxiety.     escitalopram 20 MG tablet  Commonly known as:  LEXAPRO  Take 20 mg by mouth daily.     FIORICET 50-325-40 MG per tablet  Generic drug:  butalbital-acetaminophen-caffeine  Take 1-2 tablets as needed for pain     HYDROcodone-acetaminophen 5-325 MG per tablet  Commonly known as:  NORCO/VICODIN  Take 1-2 tablets by mouth every 4 (four) hours as needed for moderate pain.     levothyroxine 100 MCG tablet  Commonly known as:  SYNTHROID, LEVOTHROID  Take 100 mcg by mouth daily.     Rivaroxaban 15 MG Tabs tablet  Commonly known as:  XARELTO  Take 1 tablet (15 mg total) by mouth 2 (two) times daily with a meal.     rivaroxaban 20 MG Tabs tablet  Commonly known as:  XARELTO  Take 1 tablet (20 mg total) by mouth daily with supper.  Start taking on:  07/01/2014     topiramate 25 MG capsule  Commonly known as:  TOPAMAX  Take 25 mg by mouth daily.     traMADol 50 MG tablet  Commonly known as:  ULTRAM  Take 1 tablet (50 mg total) by mouth every 6 (six) hours as needed for moderate pain.       Allergies  Allergen Reactions  . Ampicillin   . Erythromycin     REACTION: nausea  . Oxycodone     "too strong"  . Penicillins   . Sulfonamide Derivatives     REACTION: dizziness   Follow-up Information   Follow up with Lorretta HarpPANOSH,WANDA KOTVAN, MD In 1 week.   Specialty:  Internal Medicine   Contact information:   [address] [place] [address] [phone]       Please follow up. (GYN as scheduled)        The results of significant diagnostics from this hospitalization (including imaging, microbiology, ancillary and laboratory) are listed below for reference.    Significant Diagnostic Studies: Dg Chest 2 View  06/09/2014   CLINICAL DATA:  Chest pain.  EXAM: CHEST  2 VIEW  COMPARISON:  None.  FINDINGS: The heart size and mediastinal contours are within normal limits. Both lungs are clear. The visualized skeletal structures are unremarkable.  IMPRESSION: No  active cardiopulmonary disease.   Electronically Signed   By: Maisie Fushomas  Register   On: 06/09/2014 14:14   Ct Angio Chest Pe W/cm &/or Wo Cm  06/09/2014   CLINICAL DATA:  Right-sided chest pain, worse with breathing. Shortness of breath with exertion.  EXAM: CT ANGIOGRAPHY CHEST WITH CONTRAST  TECHNIQUE: Multidetector CT imaging of the chest was performed using the standard protocol during bolus administration of intravenous contrast. Multiplanar CT image reconstructions and MIPs were obtained to evaluate the vascular anatomy.  CONTRAST:  70mL OMNIPAQUE IOHEXOL 350 MG/ML SOLN  COMPARISON:  Chest x-ray earlier today.  FINDINGS: There are bilateral lower lobe pulmonary emboli within multiple lower lobe segmental branches. This is associated with ground-glass airspace opacity in the posterior right lung base, possibly early  pulmonary infarct in. Left lung is clear. No pleural effusions.  Heart is normal size. Aorta is normal caliber. No mediastinal, hilar, or axillary adenopathy. Chest wall soft tissues are unremarkable. Bilateral breast prostheses noted. Imaging into the upper abdomen shows no acute findings. Cystic area within the anterior liver, likely benign/simple cyst.  Review of the MIP images confirms the above findings.  IMPRESSION: Multiple bilateral lower lobe pulmonary emboli. Ground-glass opacity in the posterior right lung base likely reflects pulmonary infarct.  Critical Value/emergent results were called by telephone at the time of interpretation on 06/09/2014 at 5:07 PM to Dr. Ferdinand LangoSam Jacobuwitz, who verbally acknowledged these results.   Electronically Signed   By: Charlett NoseKevin  Dover M.D.   On: 06/09/2014 17:08   Ct Abdomen Pelvis W Contrast  05/17/2014   CLINICAL DATA:  Left lower quadrant pelvic pain, history of ruptured ovarian cyst, heme-positive stools  EXAM: CT ABDOMEN AND PELVIS WITH CONTRAST  TECHNIQUE: Multidetector CT imaging of the abdomen and pelvis was performed using the standard protocol  following bolus administration of intravenous contrast.  CONTRAST:  100mL OMNIPAQUE IOHEXOL 300 MG/ML  SOLN  COMPARISON:  None.  FINDINGS: Lung bases are clear.  17 x 15 mm cyst in the medial segment left hepatic lobe (series 2/ image 17).  Spleen, pancreas, and adrenal glands are within normal limits.  Gallbladder is unremarkable. No intrahepatic or extrahepatic ductal dilatation.  4 mm nonobstructing left lower pole renal calculus (series 2/ image 31). Right kidney is within normal limits. No hydronephrosis.  No evidence of bowel obstruction. Normal appendix. Mild to moderate colonic stool burden. No colonic wall thickening or inflammatory changes.  No evidence of abdominal aortic aneurysm.  No abdominopelvic ascites.  No suspicious abdominopelvic lymphadenopathy.  Heterogeneous enhancement of the uterine fundus (sagittal image 73), nonspecific but suspicious for adenomyosis.  Mild ovarian asymmetry, left greater than right (series 2/image 63), although without definite mass on CT.  Bladder is underdistended but within normal limits.  Visualized osseous structures are within normal limits.  IMPRESSION: No evidence of bowel obstruction.  Normal appendix.  No colonic wall thickening or inflammatory changes.  Mild to moderate colonic stool burden, raising the possibility of constipation.  Possible uterine adenomyosis. Consider MR pelvis with/without contrast for confirmation as clinically warranted.   Electronically Signed   By: Charline BillsSriyesh  Krishnan M.D.   On: 05/17/2014 10:12    Microbiology: No results found for this or any previous visit (from the past 240 hour(s)).   Labs: Basic Metabolic Panel:  Recent Labs Lab 06/09/14 1220  NA 139  K 4.1  CL 102  CO2 23  GLUCOSE 88  BUN 12  CREATININE 0.76  CALCIUM 9.2   Liver Function Tests: No results found for this basename: AST, ALT, ALKPHOS, BILITOT, PROT, ALBUMIN,  in the last 168 hours No results found for this basename: LIPASE, AMYLASE,  in the last  168 hours No results found for this basename: AMMONIA,  in the last 168 hours CBC:  Recent Labs Lab 06/09/14 1220  WBC 7.7  HGB 13.5  HCT 39.4  MCV 97.3  PLT 235   Cardiac Enzymes: No results found for this basename: CKTOTAL, CKMB, CKMBINDEX, TROPONINI,  in the last 168 hours BNP: BNP (last 3 results)  Recent Labs  06/09/14 1220  PROBNP 50.6   CBG: No results found for this basename: GLUCAP,  in the last 168 hours     Signed:  Benjamine MolaVANN, Marguetta Windish  [facility] 06/10/2014, 2:40 PM

## 2014-06-10 NOTE — Progress Notes (Signed)
06/10/2014 2:31 PM Nursing note Pt. Ambulated 150 ft in hallway with RN and on RA. Pt. Oxygen saturations checked before, during and after ambulation and were 100% on RA. Pt. Did c/o some SOB and discomfort during walk. Dr. Benjamine MolaVann on floor and made aware. Will continue to monitor patient.  Bresha Hosack, Blanchard KelchStephanie Ingold

## 2014-06-10 NOTE — Progress Notes (Signed)
VASCULAR LAB PRELIMINARY  PRELIMINARY  PRELIMINARY  PRELIMINARY  Bilateral lower extremity venous duplex completed.    Preliminary report:  Bilateral:  No evidence of DVT, superficial thrombosis, or Baker's Cyst.   Desiree Bauer, RVS 06/10/2014, 9:28 AM

## 2014-06-10 NOTE — Progress Notes (Signed)
06/10/2014 8:07 AM Nursing note Pt. Extremely anxious regarding administration of Lovenox Injection this morning after questionable reaction last evening in ED with first dose administration. Family requesting that MD be present with patient at the time of next administration. Dr. Benjamine MolaVann paged and made aware of pt. Request. Dr. Benjamine MolaVann stated she would be here by 0930 and to reschedule administration but to make patient aware of risk of delay. Orders enacted and patient updated on plan of care. Will continue to closely monitor patient.  Chitara Clonch, Blanchard KelchStephanie Ingold

## 2014-06-10 NOTE — Progress Notes (Signed)
06/10/2014 1700 Nursing note Discharge avs form, medications already taken today and those due this evening given and explained to patient and husband. Correct taper/scheduling of Xarelto reviewed. rx given to patient. Follow up appointments and when to call MD reviewed. D/c iv line. D/c tele. D/c home per orders.  Shawneequa Baldridge, Blanchard KelchStephanie Ingold

## 2014-06-11 ENCOUNTER — Telehealth: Payer: Self-pay | Admitting: Internal Medicine

## 2014-06-11 LAB — GLUCOSE, CAPILLARY: GLUCOSE-CAPILLARY: 91 mg/dL (ref 70–99)

## 2014-06-11 NOTE — Telephone Encounter (Signed)
Pt is needing to re-establish with dr. Fabian SharpPanosh. Pt has not been seen since 2000 however pt was released from the hospital  ([facility]) lastnight, pt has blood clots in both lungs. Pt is needing a fu this week.  Ok to schedule and use sda?

## 2014-06-11 NOTE — Telephone Encounter (Signed)
Dr. Fabian SharpPanosh is not in the office this week.

## 2014-06-11 NOTE — Telephone Encounter (Signed)
Pt called back and stated she talked to her GYN and they are going to refer her to Pulmonary.  She doesn't need to see Dr. Fabian SharpPanosh anymore.

## 2014-06-13 ENCOUNTER — Encounter: Payer: Self-pay | Admitting: Internal Medicine

## 2014-06-13 ENCOUNTER — Ambulatory Visit (INDEPENDENT_AMBULATORY_CARE_PROVIDER_SITE_OTHER): Payer: 59 | Admitting: Internal Medicine

## 2014-06-13 VITALS — BP 106/70 | HR 67 | Temp 98.3°F | Ht 62.0 in | Wt 125.0 lb

## 2014-06-13 DIAGNOSIS — I2699 Other pulmonary embolism without acute cor pulmonale: Secondary | ICD-10-CM

## 2014-06-13 LAB — FACTOR 5 LEIDEN

## 2014-06-13 LAB — PROTHROMBIN GENE MUTATION

## 2014-06-13 MED ORDER — MELOXICAM 7.5 MG PO TABS: ORAL_TABLET | ORAL | Status: DC

## 2014-06-13 NOTE — Progress Notes (Signed)
Subjective:    Patient ID: Desiree StagerLori B Bauer, female    DOB: 1971/08/26  MRN: [id number]  HPI  [age] yowf never smoker placed on BCPs in May 2015  for ovarian cyst then acute onset June 9th R upper cp worse with supine position gradually worse and then involved r lower /side/ back and worse with deep breathing > ER June 14 with dx of PE referred 06/13/2014 to pulmonary clinic by Dr Adalberto IllGrewall  Admit date: 06/09/2014  Discharge date: 06/10/2014   Recommendations for Outpatient Follow-up:  1. xarelto for PE 2. NSAIDs for lung pain- given script for ultram Discharge Diagnoses:  Principal Problem:  Acute pulmonary embolism  Active Problems:  Anxiety state, unspecified  Embolism, pulmonary with infarction      06/13/2014 1st [facility] office visit/ Wert  Chief Complaint  Patient presents with  . Pulmonary Consult    Referred per Dr. Vincente PoliGrewal for f/u on PE.  Pt c/o SOB and CP since 06/04/14- went to ED 06/09/14 and dxed with PE.    main complaint is severe R pleuritic cp mod improvement on vicodin 5 but not on ultram No hemoptysis. Breathing starting to get better, no longer with adls or room to room   No obvious other patterns in day to day or daytime variabilty or assoc chronic cough or cp or chest tightness, subjective wheeze overt sinus or hb symptoms. No unusual exp hx or h/o childhood pna/ asthma or knowledge of premature birth.  Sleeping ok without nocturnal  or early am exacerbation  of respiratory  c/o's or need for noct saba. Also denies any obvious fluctuation of symptoms with weather or environmental changes or other aggravating or alleviating factors except as outlined above   Current Medications, Allergies, Complete Past Medical History, Past Surgical History, Family History, and Social History were reviewed in [facility] electronic medical record.              Review of Systems  Constitutional: Negative for fever, chills and unexpected weight change.  HENT:  Negative for congestion, dental problem, ear pain, nosebleeds, postnasal drip, rhinorrhea, sinus pressure, sneezing, sore throat, trouble swallowing and voice change.   Eyes: Negative for visual disturbance.  Respiratory: Positive for shortness of breath. Negative for cough and choking.   Cardiovascular: Positive for chest pain. Negative for leg swelling.  Gastrointestinal: Negative for vomiting, abdominal pain and diarrhea.  Genitourinary: Negative for difficulty urinating.  Musculoskeletal: Negative for arthralgias.  Skin: Negative for rash.  Neurological: Negative for tremors, syncope and headaches.  Hematological: Does not bruise/bleed easily.       Objective:   Physical Exam  Wt Readings from Last 3 Encounters:  06/13/14 125 lb (56.7 kg)  06/09/14 121 lb 11.1 oz (55.2 kg)  06/05/14 120 lb 9.6 oz (54.704 kg)      HEENT: nl dentition, turbinates, and orophanx. Nl external ear canals without cough reflex   NECK :  without JVD/Nodes/TM/ nl carotid upstrokes bilaterally   LUNGS: no acc muscle use, decreased bs at R base with very subtle rub no sign dullness   CV:  RRR  no s3 or murmur or increase in P2, no edema   ABD:  soft and nontender with nl excursion in the supine position. No bruits or organomegaly, bowel sounds nl  MS:  warm without deformities, calf tenderness, cyanosis or clubbing  SKIN: warm and dry without lesions    NEURO:  alert, approp, no deficits    06/09/14 Multiple bilateral lower lobe  pulmonary emboli. Ground-glass opacity  in the posterior right lung base likely reflects pulmonary infarct.       Assessment & Plan:

## 2014-06-13 NOTE — Patient Instructions (Addendum)
Try mobic 7.5 mg twice daily with meals to see if relieve the pain and if not after a couple of days stop it  Use vicodin to back up the mobic as needed   Please schedule a follow up visit in 3 months but call sooner if needed (or Dr Vincente PoliGrewal can fill it) but the plan would be 6 months of xarelto Late add pepcid ac 20 mg bid pc as long as on mobic

## 2014-06-14 NOTE — Assessment & Plan Note (Addendum)
Dx 06/09/14 on BCPs with neg venous dopplers, no echo > rx xarelto  Extended discussion with pt and mother emphasizing she's doing great this soon after multiple pe with infarction of RLL segment.  1) clearly related to bcp's so d/c'd and rec  6 m xarelto then stop and no need for f/u studies unless symptomatic at that point  2) control pain with mobic instead of ultram or other nsaids as least effect on plts thought there is slt increased risk of bleeding and should prob be on pepcid ac 20 bid while on mobic  See instructions for specific recommendations which were reviewed directly with the patient who was given a copy with highlighter outlining the key components.

## 2014-06-21 NOTE — Telephone Encounter (Signed)
Pt was seen on 06-05-14

## 2014-07-02 ENCOUNTER — Ambulatory Visit: Payer: 59 | Admitting: Internal Medicine

## 2014-07-26 ENCOUNTER — Other Ambulatory Visit (INDEPENDENT_AMBULATORY_CARE_PROVIDER_SITE_OTHER): Payer: 59

## 2014-07-26 ENCOUNTER — Encounter: Payer: Self-pay | Admitting: Internal Medicine

## 2014-07-26 ENCOUNTER — Ambulatory Visit (INDEPENDENT_AMBULATORY_CARE_PROVIDER_SITE_OTHER): Payer: 59 | Admitting: Internal Medicine

## 2014-07-26 VITALS — BP 94/62 | HR 68 | Temp 98.3°F | Ht 62.0 in | Wt 123.6 lb

## 2014-07-26 DIAGNOSIS — E038 Other specified hypothyroidism: Secondary | ICD-10-CM

## 2014-07-26 DIAGNOSIS — I2699 Other pulmonary embolism without acute cor pulmonale: Secondary | ICD-10-CM

## 2014-07-26 DIAGNOSIS — E039 Hypothyroidism, unspecified: Secondary | ICD-10-CM | POA: Insufficient documentation

## 2014-07-26 LAB — CBC WITH DIFFERENTIAL/PLATELET
Basophils Absolute: 0 10*3/uL (ref 0.0–0.1)
Basophils Relative: 0.5 % (ref 0.0–3.0)
EOS PCT: 1.2 % (ref 0.0–5.0)
Eosinophils Absolute: 0.1 10*3/uL (ref 0.0–0.7)
HCT: 40.8 % (ref 36.0–46.0)
Hemoglobin: 13.6 g/dL (ref 12.0–15.0)
LYMPHS PCT: 38.8 % (ref 12.0–46.0)
Lymphs Abs: 2.5 10*3/uL (ref 0.7–4.0)
MCHC: 33.4 g/dL (ref 30.0–36.0)
MCV: 98.2 fl (ref 78.0–100.0)
MONOS PCT: 10 % (ref 3.0–12.0)
Monocytes Absolute: 0.6 10*3/uL (ref 0.1–1.0)
NEUTROS PCT: 49.5 % (ref 43.0–77.0)
Neutro Abs: 3.1 10*3/uL (ref 1.4–7.7)
PLATELETS: 265 10*3/uL (ref 150.0–400.0)
RBC: 4.15 Mil/uL (ref 3.87–5.11)
RDW: 13.7 % (ref 11.5–15.5)
WBC: 6.4 10*3/uL (ref 4.0–10.5)

## 2014-07-26 NOTE — Patient Instructions (Addendum)
Ok to taper mobic > take as needed for pain when breath but take with meals and if you do need to stay on it then take pepcid ac 20mg  after meals twice daily to reduce risk of stomach bleeding on xarelto  Work your eliptical up to 30 minutes daily as much as possible before your trip   Please see patient coordinator before you leave today  to schedule 2d Echo, needs to be done before your trip for sure to be sure the pressures are normal  Please remember to go to the lab  department downstairs for your tests - we will call you with the results when they are available.

## 2014-07-26 NOTE — Progress Notes (Signed)
Subjective:    Patient ID: Desiree Bauer, female    DOB: 1971/03/19  MRN: [id number]    Brief patient profile:  [age] yowf never smoker placed on BCPs in May 2015  for ovarian cyst then acute onset June 9th R upper cp worse with supine position gradually worse and then involved r lower /side/ back and worse with deep breathing > ER June 14 with dx of PE referred 06/13/2014 to pulmonary clinic by Dr Adalberto IllGrewall  Admit date: 06/09/2014  Discharge date: 06/10/2014   Recommendations for Outpatient Follow-up:  1. xarelto for PE 2. NSAIDs for lung pain- given script for ultram Discharge Diagnoses:  Principal Problem:  Acute pulmonary embolism  Active Problems:  Anxiety state, unspecified  Embolism, pulmonary with infarction      06/13/2014 1st [facility] office visit/ Desiree Bauer  Chief Complaint  Patient presents with  . Pulmonary Consult    Referred per Dr. Vincente PoliGrewal for f/u on PE.  Pt c/o SOB and CP since 06/04/14- went to ED 06/09/14 and dxed with PE.   main complaint is severe R pleuritic cp mod improvement on vicodin 5 but not on ultram No hemoptysis. Breathing starting to get better, no longer with adls or room to room  rec Try mobic 7.5 mg twice daily with meals to see if relieve the pain and if not after a couple of days stop it Use vicodin to back up the mobic as needed  Please schedule a follow up visit in 3 months but call sooner if needed (or Dr Vincente PoliGrewal can fill it) but the plan would be 6 months of xarelto Late add pepcid ac 20 mg bid pc as long as on mobic   07/26/2014 f/u ov/Desiree Bauer re: poor tol of heat  Chief Complaint  Patient presents with  . Follow-up    Pt states that is he is outside in the heat for longer than 10-15 min she gets out of breath and panics.  CP has improved some, but still present.   on elipitical x 20 min s stopping prev could do 30 min @  more resistance. Only pain with deep breath, still taking mobic   No obvious day to day or daytime variabilty or assoc  chronic cough or   chest tightness, subjective wheeze overt sinus or hb symptoms. No unusual exp hx or h/o childhood pna/ asthma or knowledge of premature birth.  Sleeping ok without nocturnal  or early am exacerbation  of respiratory  c/o's or need for noct saba. Also denies any obvious fluctuation of symptoms with weather or environmental changes or other aggravating or alleviating factors except as outlined above   Current Medications, Allergies, Complete Past Medical History, Past Surgical History, Family History, and Social History were reviewed in [facility] electronic medical record.  ROS  The following are not active complaints unless bolded sore throat, dysphagia, dental problems, itching, sneezing,  nasal congestion or excess/ purulent secretions, ear ache,   fever, chills, sweats, unintended wt loss,  exertional cp, hemoptysis,  orthopnea pnd or leg swelling, presyncope, palpitations, heartburn, abdominal pain, anorexia, nausea, vomiting, diarrhea  or change in bowel or urinary habits, change in stools or urine, dysuria,hematuria,  rash, arthralgias, visual complaints, headache, numbness weakness or ataxia or problems with walking or coordination,  change in mood/affect or memory.                    Objective:   Physical Exam   07/26/2014  124  Wt Readings from Last 3 Encounters:  06/13/14 125 lb (56.7 kg)  06/09/14 121 lb 11.1 oz (55.2 kg)  06/05/14 120 lb 9.6 oz (54.704 kg)      HEENT: nl dentition, turbinates, and orophanx. Nl external ear canals without cough reflex   NECK :  without JVD/Nodes/TM/ nl carotid upstrokes bilaterally   LUNGS: no acc muscle use, decreased bs at R base , no sign rub, no sign dullness   CV:  RRR  no s3 or murmur or increase in P2, no edema   ABD:  soft and nontender with nl excursion in the supine position. No bruits or organomegaly, bowel sounds nl  MS:  warm without deformities, calf tenderness, cyanosis or clubbing  SKIN:  warm and dry without lesions    NEURO:  alert, approp, no deficits     CXR  07/26/2014 :   Requested   Recent Labs Lab 07/26/14 1708  NA 138  K 4.4  CL 106  CO2 20  BUN 12  CREATININE 0.9  GLUCOSE 98    Recent Labs Lab 07/26/14 1708  HGB 13.6  HCT 40.8  WBC 6.4  PLT 265.0      Lab Results  Component Value Date   TSH 2.65 07/26/2014        Assessment & Plan:

## 2014-07-29 LAB — BASIC METABOLIC PANEL
BUN: 12 mg/dL (ref 6–23)
CHLORIDE: 106 meq/L (ref 96–112)
CO2: 20 mEq/L (ref 19–32)
CREATININE: 0.9 mg/dL (ref 0.4–1.2)
Calcium: 9.1 mg/dL (ref 8.4–10.5)
GFR: 71.1 mL/min (ref >60.00)
Glucose, Bld: 98 mg/dL (ref 70–99)
Potassium: 4.4 mEq/L (ref 3.5–5.1)
Sodium: 138 mEq/L (ref 135–145)

## 2014-07-30 LAB — TSH: TSH: 2.65 u[IU]/mL (ref 0.35–4.50)

## 2014-07-30 NOTE — Progress Notes (Signed)
Quick Note:  Spoke with pt and notified of results per Dr. Wert. Pt verbalized understanding and denied any questions.  ______ 

## 2014-07-30 NOTE — Assessment & Plan Note (Addendum)
Dx 06/09/14 on BCPs with neg venous dopplers, neg hypercoagulable profile, no echo > rx xarelto   Strongly doubt the ex intolernance (20 min instead  Of 30 on elipitical) and heat intol are directly related to pulmonary or PAH related residual from recent PE but did  rec cxr and echo prior to trip to [place] where she'll really need to pace herself accordingly but should be safe to travel  See instructions for specific recommendations which were reviewed directly with the patient who was given a copy with highlighter outlining the key components.

## 2014-07-30 NOTE — Assessment & Plan Note (Signed)
Adequate control on present rx, reviewed > no change in rx needed   

## 2014-08-01 ENCOUNTER — Ambulatory Visit (INDEPENDENT_AMBULATORY_CARE_PROVIDER_SITE_OTHER)
Admission: RE | Admit: 2014-08-01 | Discharge: 2014-08-01 | Disposition: A | Discharge status: Home / Self Care | Payer: 59 | Source: Ambulatory Visit | Attending: Internal Medicine | Admitting: Internal Medicine

## 2014-08-01 ENCOUNTER — Encounter: Payer: Self-pay | Admitting: Internal Medicine

## 2014-08-01 ENCOUNTER — Ambulatory Visit (HOSPITAL_COMMUNITY)
Admission: RE | Admit: 2014-08-01 | Discharge: 2014-08-01 | Disposition: A | Discharge status: Home / Self Care | Payer: 59 | Source: Ambulatory Visit | Attending: Cardiovascular Disease | Admitting: Cardiovascular Disease

## 2014-08-01 DIAGNOSIS — I059 Rheumatic mitral valve disease, unspecified: Secondary | ICD-10-CM

## 2014-08-01 DIAGNOSIS — R0602 Shortness of breath: Secondary | ICD-10-CM

## 2014-08-01 DIAGNOSIS — I2699 Other pulmonary embolism without acute cor pulmonale: Secondary | ICD-10-CM

## 2014-08-01 NOTE — Progress Notes (Signed)
2D Echo Performed 08/01/2014    Desiree Bauer, RCS  

## 2014-08-01 NOTE — Progress Notes (Signed)
Quick Note:  Spoke with pt and notified of results per Dr. Wert. Pt verbalized understanding and denied any questions.  ______ 

## 2014-08-02 ENCOUNTER — Ambulatory Visit (HOSPITAL_COMMUNITY): Payer: 59

## 2014-08-20 ENCOUNTER — Telehealth: Payer: Self-pay | Admitting: *Deleted

## 2014-08-20 ENCOUNTER — Other Ambulatory Visit: Payer: Self-pay | Admitting: Internal Medicine

## 2014-08-20 DIAGNOSIS — R0600 Dyspnea, unspecified: Secondary | ICD-10-CM

## 2014-08-20 NOTE — Telephone Encounter (Signed)
Message copied by Christen Butter on Tue Aug 20, 2014  9:33 AM ------      Message from: Sandrea Hughs B      Created: Mon Aug 19, 2014  3:48 PM       Don't see the bubble study though       ----- Message -----         From: Christen Butter, CMA         Sent: 08/19/2014  12:27 PM           To: Nyoka Cowden, MD            ECHO was done on 08/01/14      ----- Message -----         From: Nyoka Cowden, MD         Sent: 08/19/2014  11:59 AM           To: Christen Butter, CMA            Does not appear to have  Had echo so if not going to do prior to leaving for [place]  let's cancel       ----- Message -----         From: Christen Butter, CMA         Sent: 08/01/2014   5:27 PM           To: Nyoka Cowden, MD            I called and advised her of her echo and she wants you to call her       She states that she wants to know why she is so SOB in the heat and wants an answer before she goes on vacation                         ------

## 2014-08-20 NOTE — Telephone Encounter (Signed)
Per MW- she was supposed to have had ECHO with Bubble Study  Spoke with the pt and she is willing to have the test redone  Order was sent to [facility]

## 2014-08-23 ENCOUNTER — Telehealth: Payer: Self-pay | Admitting: Internal Medicine

## 2014-08-23 NOTE — Telephone Encounter (Signed)
Called and spoke with pt and she stated that she just wanted some clarification on the new echo study that she is going to have.  She wanted to make sure that she is not going to be charged for another test.  MW please advise. thanks

## 2014-08-24 NOTE — Telephone Encounter (Signed)
There will likely be a charge for the addition of the bubbles to be sure there is not a leak in her heart but she was not charged for that the first time around and we will make sure the insurance company understands.  Let her know I  am sorry for the computer glitz that caused this - in previous times I could just write it out exactly the way she needed it done but those times are no more...Marland KitchenMarland Kitchen

## 2014-08-26 NOTE — Telephone Encounter (Signed)
Called made pt aware of recs.  Pt wants to know how long will it take for the clots to go away and not have to worry about it? Please advise thanks

## 2014-08-26 NOTE — Telephone Encounter (Signed)
Called pt. She still does not understand anything we are saying to her. She requests MW to call her personally and discuss all of this. She still does not understand why she needs echo done vs CT scan? Pt had several other questions. Please advise MW thanks

## 2014-08-26 NOTE — Telephone Encounter (Signed)
Echo scheduled for 08/28/14. MW wants this cancelled. Please advise thanks

## 2014-08-26 NOTE — Telephone Encounter (Signed)
Spoke with patient--Explained to patient the that she was not charged for the first ECHO but that for this study with the bubbles, she will likely be charged for the addition of the bubbles. Pt asking the difference between the two ECHO studies--explained this to the patient. Expressed understanding.  Pt asking how long she will have to remain on the blood thinners? Is there a certain time frame in which she can stop taking them? How long does it take for the clots to resolve and no longer be any issue in her lungs?  Pt is not wanting to be on this medication any longer.   Please advise Dr Sherene Sires. Thanks.

## 2014-08-26 NOTE — Telephone Encounter (Signed)
Discussed with pt/ please Cancel the Echo

## 2014-08-26 NOTE — Telephone Encounter (Signed)
It is probably already mostly gone, we can discuss all these issues in more detail after we complete the w/u and definitely before we stop the blood thinner but there are no alternatives to present rx based on whether there a few clots still remaining or not (treatment would be exactly same so no reason to worry about it)

## 2014-08-26 NOTE — Telephone Encounter (Signed)
The plan was to use the med for 6 months then regroup re risk / benefits but likely I will rec stop at that point

## 2014-08-26 NOTE — Telephone Encounter (Signed)
Called cardiology and spoke to debra and cancelled this appt Desiree Bauer

## 2014-08-28 ENCOUNTER — Other Ambulatory Visit (HOSPITAL_COMMUNITY): Payer: 59

## 2014-11-25 ENCOUNTER — Ambulatory Visit (INDEPENDENT_AMBULATORY_CARE_PROVIDER_SITE_OTHER): Payer: 59 | Admitting: Internal Medicine

## 2014-11-25 ENCOUNTER — Encounter: Payer: Self-pay | Admitting: Internal Medicine

## 2014-11-25 VITALS — BP 94/60 | HR 60 | Ht 62.0 in | Wt 127.0 lb

## 2014-11-25 DIAGNOSIS — I2699 Other pulmonary embolism without acute cor pulmonale: Secondary | ICD-10-CM

## 2014-11-25 NOTE — Patient Instructions (Addendum)
Safest option to prevent clots is lots of exercise and at least one daily  Aspirin 81 mg, ideally 325 mg daily (but always hold aspirin if active bleeding as is the case now )  Classic subdiaphragmatic pain pattern suggests ibs:  Stereotypical,   very limited distribution of pain locations, daytime, not exacerbated by ex or coughing, worse in sitting position, associated with generalized abd bloating, not present supine due to the dome effect of the diaphragm is  canceled in that position. Frequently these patients have had multiple negative GI workups and CT scans.  Treatment consists of avoiding foods that cause gas (especially beans and raw vegetables like spinach and salads and boiled eggs)  and citrucel 1 heaping tsp twice daily with a large glass of water.  Pain should improve w/in 2 weeks and if not then consider further GI work up.     Pulmonary follow up is as needed

## 2014-11-25 NOTE — Assessment & Plan Note (Signed)
Dx 06/09/14 on BCPs with neg venous dopplers, neg hypercoagulable profile, no echo > rx xarelto - Echo 08/01/2014 > nl ex mild MR > prophylaxis rec - stopped xarelto 11/15/14 and rec asa daily / exercise   I had an extended  Summary discussion with the patient reviewing all relevant studies completed to date and  lasting 15 to 20 minutes of a 25 minute visit on the following ongoing concerns:   She is at relatively low risk of recurrent blood clots given she is thin and healthy and active and no longer on estrogens  Progresterone only pills appear to be safe alternative for pts with h/o clotting per up to date.  Residual cps are not pleuritic and likely just IBS > rec diet and citrucel and f/u prn

## 2014-11-25 NOTE — Progress Notes (Signed)
Subjective:    Patient ID: Desiree Bauer, female    DOB: 1971/12/17  MRN: [id number]    Brief patient profile:  [age] yowf never smoker placed on BCPs in May 2015  for ovarian cyst then acute onset June 9th R upper cp worse with supine position gradually worse and then involved r lower /side/ back and worse with deep breathing > ER June 14 with dx of PE referred 06/13/2014 to pulmonary clinic by Dr Adalberto IllGrewall  Admit date: 06/09/2014  Discharge date: 06/10/2014   Recommendations for Outpatient Follow-up:  1. xarelto for PE 2. NSAIDs for lung pain- given script for ultram Discharge Diagnoses:  Principal Problem:  Acute pulmonary embolism  Active Problems:  Anxiety state, unspecified  Embolism, pulmonary with infarction      06/13/2014 1st [facility] office visit/ Desiree Bauer  Chief Complaint  Patient presents with  . Pulmonary Consult    Referred per Dr. Vincente PoliGrewal for f/u on PE.  Pt c/o SOB and CP since 06/04/14- went to ED 06/09/14 and dxed with PE.   main complaint is severe R pleuritic cp mod improvement on vicodin 5 but not on ultram No hemoptysis. Breathing starting to get better, no longer with adls or room to room  rec Try mobic 7.5 mg twice daily with meals to see if relieve the pain and if not after a couple of days stop it Use vicodin to back up the mobic as needed  Please schedule a follow up visit in 3 months but call sooner if needed (or Dr Vincente PoliGrewal can fill it) but the plan would be 6 months of xarelto Late add pepcid ac 20 mg bid pc as long as on mobic   07/26/2014 f/u ov/Desiree Bauer re: poor tol of heat  Chief Complaint  Patient presents with  . Follow-up    Pt states that is he is outside in the heat for longer than 10-15 min she gets out of breath and panics.  CP has improved some, but still present.   on elipitical x 20 min s stopping prev could do 30 min @  more resistance. Only pain with deep breath, still taking mobic rec Ok to taper mobic > take as needed for pain when  breath but take with meals and if you do need to stay on it then take pepcid ac 20mg  after meals twice daily to reduce risk of stomach bleeding on xarelto Work your eliptical up to 30 minutes daily as much as possible before your trip    schedule 2d Echo,> MR only, ok RV     11/25/2014 f/u ov/Desiree Bauer re: end of treatment/ summary f/u ov - stopped xarelto x 10 d Chief Complaint  Patient presents with  . Follow-up    Pt states that her breathing is back to normal baseline. She is concerned about taking progesterone- wonders if this could trigger PE to come back. She c/o occ pain on her right side.   still having fleeting cp's same areas as before esp R flank worse sitting up not pleuritic Still also having freq vag bleeding and gyn feels needs hysterectomy in meantime rx POP (progresterone only) and wants to know if this is ok Not doing much aerobics but Not limited by breathing from desired activities and did mountain climbing in [place] ok    No obvious day to day or daytime variabilty or assoc chronic cough or chest tightness, subjective wheeze overt sinus or hb symptoms. No unusual exp hx or h/o childhood pna/  asthma or knowledge of premature birth.  Sleeping ok without nocturnal  or early am exacerbation  of respiratory  c/o's or need for noct saba. Also denies any obvious fluctuation of symptoms with weather or environmental changes or other aggravating or alleviating factors except as outlined above   Current Medications, Allergies, Complete Past Medical History, Past Surgical History, Family History, and Social History were reviewed in [facility] electronic medical record.  ROS  The following are not active complaints unless bolded sore throat, dysphagia, dental problems, itching, sneezing,  nasal congestion or excess/ purulent secretions, ear ache,   fever, chills, sweats, unintended wt loss,  exertional cp, hemoptysis,  orthopnea pnd or leg swelling, presyncope, palpitations,  heartburn, abdominal pain, anorexia, nausea, vomiting, diarrhea  or change in bowel or urinary habits, change in stools or urine, dysuria,hematuria,  rash, arthralgias, visual complaints, headache, numbness weakness or ataxia or problems with walking or coordination,  change in mood/affect or memory.                    Objective:   Physical Exam   07/26/2014        124 > 11/25/2014  127  Wt Readings from Last 3 Encounters:  06/13/14 125 lb (56.7 kg)  06/09/14 121 lb 11.1 oz (55.2 kg)  06/05/14 120 lb 9.6 oz (54.704 kg)      HEENT: nl dentition, turbinates, and orophanx. Nl external ear canals without cough reflex   NECK :  without JVD/Nodes/TM/ nl carotid upstrokes bilaterally   LUNGS: no acc muscle use, clear to  A and P    CV:  RRR  no s3 or murmur or increase in P2, no edema   ABD:  soft and nontender with nl excursion in the supine position. No bruits or organomegaly, bowel sounds nl  MS:  warm without deformities, calf tenderness, cyanosis or clubbing  SKIN: warm and dry without lesions          CXR   08/01/14 No evidence of acute cardiopulmonary disease.   Recent Labs Lab 07/26/14 1708  NA 138  K 4.4  CL 106  CO2 20  BUN 12  CREATININE 0.9  GLUCOSE 98    Recent Labs Lab 07/26/14 1708  HGB 13.6  HCT 40.8  WBC 6.4  PLT 265.0      Lab Results  Component Value Date   TSH 2.65 07/26/2014        Assessment & Plan:

## 2015-02-12 ENCOUNTER — Other Ambulatory Visit: Payer: Self-pay | Admitting: Obstetrics and Gynecology

## 2015-02-13 LAB — CYTOLOGY - PAP

## 2015-09-18 NOTE — H&P (Signed)
  44 year old G 3 P 2 with pelvic pain and menorrhagia.  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  Past Medical History  Diagnosis Date  . Allergy   . Depression   . Hypothyroidism   . Anxiety   . Chronic migraine   . Ruptured ovarian cyst    Past Surgical History  Procedure Laterality Date  . Breast enhancement surgery    . Foot surgery Bilateral     for plantar faciitis  . Uterine ablation    . Mortens neuroma removed     Prior to Admission medications   Medication Sig Start Date End Date Taking? Authorizing Provider  acetaminophen (TYLENOL) 500 MG tablet Take 500 mg by mouth every 6 (six) hours as needed for mild pain or headache.   Yes Historical Provider, MD  ALPRAZolam Prudy Feeler) 1 MG tablet Take 1 mg by mouth at bedtime as needed for sleep.    Yes Historical Provider, MD  B Complex Vitamins (VITAMIN B COMPLEX) TABS Take 1 tablet by mouth daily.   Yes Historical Provider, MD  cholecalciferol (VITAMIN D) 1000 UNITS tablet Take 4,000 Units by mouth daily.    Yes Historical Provider, MD  escitalopram (LEXAPRO) 10 MG tablet Take 10 mg by mouth daily.   Yes Historical Provider, MD  ibuprofen (ADVIL,MOTRIN) 200 MG tablet Take 200 mg by mouth every 6 (six) hours as needed for fever or moderate pain.   Yes Historical Provider, MD  levothyroxine (SYNTHROID, LEVOTHROID) 100 MCG tablet Take 100 mcg by mouth daily.   Yes Historical Provider, MD  topiramate (TOPAMAX) 25 MG capsule Take 25 mg by mouth daily.   Yes Historical Provider, MD   Allergies Ampicillin, Erythromycin, Oxycodone, Penicillin  Family History  Problem Relation Age of Onset  . Multiple sclerosis Mother   . Endometrial cancer Mother   . Thyroid disease Mother   . Thyroid disease Father   . Hypertension Father   . Stroke Maternal Grandmother   . Colon cancer Neg Hx   . Throat cancer Neg Hx   . Diabetes Paternal Uncle   . Heart disease Neg Hx   . Liver  disease Neg Hx   . Kidney disease Neg Hx   . Rheum arthritis Maternal Grandmother    Social History  Substance Use Topics  . Smoking status: Never Smoker   . Smokeless tobacco: Never Used  . Alcohol Use: Yes     Comment: 1-2 drinks daily   There were no vitals taken for this visit. General alert and oriented Lung CTAB Car RRR Abdomen is soft and non tender  IMPRESSION: Pelvic pain, menorrhagia and dysmenorrhea  PLAN: LAVH, Bilateral salpingectomy Risks reviewed Consent signed

## 2015-09-24 ENCOUNTER — Encounter (HOSPITAL_COMMUNITY)
Admission: RE | Admit: 2015-09-24 | Discharge: 2015-09-24 | Disposition: A | Discharge status: Home / Self Care | Payer: Commercial Managed Care - PPO | Source: Ambulatory Visit | Attending: Obstetrics and Gynecology | Admitting: Obstetrics and Gynecology

## 2015-09-24 ENCOUNTER — Encounter (HOSPITAL_COMMUNITY): Payer: Self-pay

## 2015-09-24 DIAGNOSIS — Z01818 Encounter for other preprocedural examination: Secondary | ICD-10-CM | POA: Insufficient documentation

## 2015-09-24 DIAGNOSIS — N946 Dysmenorrhea, unspecified: Secondary | ICD-10-CM | POA: Diagnosis not present

## 2015-09-24 HISTORY — DX: Chronic kidney disease, unspecified: N18.9

## 2015-09-24 LAB — BASIC METABOLIC PANEL
Anion gap: 7 (ref 5–15)
BUN: 11 mg/dL (ref 6–20)
CALCIUM: 9.1 mg/dL (ref 8.9–10.3)
CO2: 26 mmol/L (ref 22–32)
CREATININE: 0.72 mg/dL (ref 0.44–1.00)
Chloride: 103 mmol/L (ref 101–111)
GFR calc Af Amer: >60 mL/min (ref >60)
GLUCOSE: 94 mg/dL (ref 65–99)
Potassium: 3.5 mmol/L (ref 3.5–5.1)
SODIUM: 136 mmol/L (ref 135–145)

## 2015-09-24 LAB — CBC
HCT: 38.7 % (ref 36.0–46.0)
Hemoglobin: 13.3 g/dL (ref 12.0–15.0)
MCH: 32.8 pg (ref 26.0–34.0)
MCHC: 34.4 g/dL (ref 30.0–36.0)
MCV: 95.6 fL (ref 78.0–100.0)
Platelets: 249 10*3/uL (ref 150–400)
RBC: 4.05 MIL/uL (ref 3.87–5.11)
RDW: 12.3 % (ref 11.5–15.5)
WBC: 6.7 10*3/uL (ref 4.0–10.5)

## 2015-09-24 NOTE — Patient Instructions (Addendum)
Your procedure is scheduled on:  September 29, 2015    Enter through the Main Entrance of [facility] at: 6:00 am   Pick up the phone at the desk and dial [phone].  Call this number if you have problems the morning of surgery: [phone].  Remember: Do NOT eat food: after midnight on Sunday  Do NOT drink clear liquids after: midnight on Sunday  Take these medicines the morning of surgery with a SIP OF WATER:  Synthroid   Do NOT wear jewelry (body piercing), metal hair clips/bobby pins, make-up, or nail polish. Do NOT wear lotions, powders, or perfumes.  You may wear deoderant. Do NOT shave for 48 hours prior to surgery. Do NOT bring valuables to the hospital. Contacts, dentures, or bridgework may not be worn into surgery. Leave suitcase in car.  After surgery it may be brought to your room.  For patients admitted to the hospital, checkout time is 11:00 AM the day of discharge.

## 2015-09-28 MED ORDER — METRONIDAZOLE IN NACL 5-0.79 MG/ML-% IV SOLN
500.0000 mg | INTRAVENOUS | Status: AC
  Administered 2015-09-29: 500 mg via INTRAVENOUS
  Filled 2015-09-28: qty 100

## 2015-09-28 MED ORDER — DEXTROSE 5 % IV SOLN
5.0000 mg/kg | INTRAVENOUS | Status: AC
  Administered 2015-09-29: 270 mg via INTRAVENOUS
  Filled 2015-09-28: qty 6.75

## 2015-09-29 ENCOUNTER — Inpatient Hospital Stay (HOSPITAL_COMMUNITY)
Admission: AD | Admit: 2015-09-29 | Discharge: 2015-09-30 | DRG: 743 | Disposition: A | Discharge status: Home / Self Care | Payer: Commercial Managed Care - PPO | Source: Ambulatory Visit | Attending: Obstetrics and Gynecology | Admitting: Obstetrics and Gynecology

## 2015-09-29 ENCOUNTER — Ambulatory Visit (HOSPITAL_COMMUNITY): Payer: Commercial Managed Care - PPO | Admitting: Anesthesiology

## 2015-09-29 ENCOUNTER — Encounter (HOSPITAL_COMMUNITY): Payer: Self-pay

## 2015-09-29 ENCOUNTER — Encounter (HOSPITAL_COMMUNITY)
Admission: AD | Disposition: A | Discharge status: Home / Self Care | Payer: Self-pay | Source: Ambulatory Visit | Attending: Obstetrics and Gynecology

## 2015-09-29 DIAGNOSIS — Z79899 Other long term (current) drug therapy: Secondary | ICD-10-CM

## 2015-09-29 DIAGNOSIS — F329 Major depressive disorder, single episode, unspecified: Secondary | ICD-10-CM | POA: Diagnosis present

## 2015-09-29 DIAGNOSIS — F419 Anxiety disorder, unspecified: Secondary | ICD-10-CM | POA: Diagnosis present

## 2015-09-29 DIAGNOSIS — Z86711 Personal history of pulmonary embolism: Secondary | ICD-10-CM | POA: Diagnosis not present

## 2015-09-29 DIAGNOSIS — N920 Excessive and frequent menstruation with regular cycle: Secondary | ICD-10-CM | POA: Diagnosis present

## 2015-09-29 DIAGNOSIS — N946 Dysmenorrhea, unspecified: Secondary | ICD-10-CM | POA: Diagnosis present

## 2015-09-29 DIAGNOSIS — Z90710 Acquired absence of both cervix and uterus: Secondary | ICD-10-CM | POA: Diagnosis present

## 2015-09-29 DIAGNOSIS — R102 Pelvic and perineal pain: Secondary | ICD-10-CM | POA: Diagnosis present

## 2015-09-29 HISTORY — PX: LAPAROSCOPIC ASSISTED VAGINAL HYSTERECTOMY: SHX5398

## 2015-09-29 HISTORY — PX: LAPAROSCOPIC BILATERAL SALPINGO OOPHERECTOMY: SHX5890

## 2015-09-29 LAB — TYPE AND SCREEN
ABO/RH(D): B POS
Antibody Screen: NEGATIVE

## 2015-09-29 LAB — ABO/RH: ABO/RH(D): B POS

## 2015-09-29 LAB — PREGNANCY, URINE: PREG TEST UR: NEGATIVE

## 2015-09-29 SURGERY — HYSTERECTOMY, VAGINAL, LAPAROSCOPY-ASSISTED
Anesthesia: General | Site: Abdomen | Laterality: Bilateral

## 2015-09-29 MED ORDER — GLYCOPYRROLATE 0.2 MG/ML IJ SOLN
INTRAMUSCULAR | Status: DC | PRN
  Administered 2015-09-29: 0.2 mg via INTRAVENOUS
  Administered 2015-09-29: 0.4 mg via INTRAVENOUS

## 2015-09-29 MED ORDER — HYDROMORPHONE HCL 1 MG/ML IJ SOLN
INTRAMUSCULAR | Status: AC
  Filled 2015-09-29: qty 1

## 2015-09-29 MED ORDER — ESMOLOL HCL 10 MG/ML IV SOLN
INTRAVENOUS | Status: AC
  Filled 2015-09-29: qty 10

## 2015-09-29 MED ORDER — LACTATED RINGERS IR SOLN
Status: DC | PRN
  Administered 2015-09-29: 3000 mL

## 2015-09-29 MED ORDER — TRAMADOL HCL 50 MG PO TABS
50.0000 mg | ORAL_TABLET | Freq: Four times a day (QID) | ORAL | Status: DC | PRN
  Administered 2015-09-30: 50 mg via ORAL
  Filled 2015-09-29: qty 1

## 2015-09-29 MED ORDER — DIPHENHYDRAMINE HCL 50 MG/ML IJ SOLN: 12.5000 mg | Freq: Four times a day (QID) | INTRAMUSCULAR | Status: DC | PRN

## 2015-09-29 MED ORDER — ONDANSETRON HCL 4 MG/2ML IJ SOLN
INTRAMUSCULAR | Status: AC
  Filled 2015-09-29: qty 2

## 2015-09-29 MED ORDER — SCOPOLAMINE 1 MG/3DAYS TD PT72
1.0000 | MEDICATED_PATCH | Freq: Once | TRANSDERMAL | Status: DC
  Administered 2015-09-29: 1.5 mg via TRANSDERMAL

## 2015-09-29 MED ORDER — KETOROLAC TROMETHAMINE 30 MG/ML IJ SOLN
INTRAMUSCULAR | Status: DC | PRN
  Administered 2015-09-29: 30 mg via INTRAVENOUS

## 2015-09-29 MED ORDER — FENTANYL CITRATE (PF) 100 MCG/2ML IJ SOLN
INTRAMUSCULAR | Status: AC
  Filled 2015-09-29: qty 2

## 2015-09-29 MED ORDER — DEXAMETHASONE SODIUM PHOSPHATE 4 MG/ML IJ SOLN
INTRAMUSCULAR | Status: AC
  Filled 2015-09-29: qty 1

## 2015-09-29 MED ORDER — SCOPOLAMINE 1 MG/3DAYS TD PT72
MEDICATED_PATCH | TRANSDERMAL | Status: AC
  Administered 2015-09-29: 1.5 mg via TRANSDERMAL
  Filled 2015-09-29: qty 1

## 2015-09-29 MED ORDER — FENTANYL CITRATE (PF) 100 MCG/2ML IJ SOLN
25.0000 ug | INTRAMUSCULAR | Status: DC | PRN
  Administered 2015-09-29 (×4): 50 ug via INTRAVENOUS

## 2015-09-29 MED ORDER — ONDANSETRON HCL 4 MG/2ML IJ SOLN: 4.0000 mg | Freq: Four times a day (QID) | INTRAMUSCULAR | Status: DC | PRN

## 2015-09-29 MED ORDER — SODIUM CHLORIDE 0.9 % IJ SOLN
INTRAMUSCULAR | Status: AC
Start: 2015-09-29 — End: 2015-09-29
  Filled 2015-09-29: qty 10

## 2015-09-29 MED ORDER — DIPHENHYDRAMINE HCL 12.5 MG/5ML PO ELIX: 12.5000 mg | ORAL_SOLUTION | Freq: Four times a day (QID) | ORAL | Status: DC | PRN

## 2015-09-29 MED ORDER — LIDOCAINE HCL (CARDIAC) 20 MG/ML IV SOLN
INTRAVENOUS | Status: DC | PRN
  Administered 2015-09-29: 100 mg via INTRAVENOUS

## 2015-09-29 MED ORDER — KETOROLAC TROMETHAMINE 30 MG/ML IJ SOLN
30.0000 mg | Freq: Once | INTRAMUSCULAR | Status: DC
Start: 2015-09-29 — End: 2015-09-29

## 2015-09-29 MED ORDER — LACTATED RINGERS IV SOLN
INTRAVENOUS | Status: DC
  Administered 2015-09-29 (×2): via INTRAVENOUS

## 2015-09-29 MED ORDER — ROCURONIUM BROMIDE 100 MG/10ML IV SOLN
INTRAVENOUS | Status: AC
  Filled 2015-09-29: qty 1

## 2015-09-29 MED ORDER — HYDROMORPHONE HCL 1 MG/ML IJ SOLN
0.5000 mg | Freq: Once | INTRAMUSCULAR | Status: AC
  Administered 2015-09-29: 0.5 mg via INTRAVENOUS

## 2015-09-29 MED ORDER — LACTATED RINGERS IV SOLN
INTRAVENOUS | Status: DC
  Administered 2015-09-29: 19:00:00 via INTRAVENOUS

## 2015-09-29 MED ORDER — MIDAZOLAM HCL 2 MG/2ML IJ SOLN
INTRAMUSCULAR | Status: DC | PRN
  Administered 2015-09-29: 2 mg via INTRAVENOUS

## 2015-09-29 MED ORDER — SODIUM CHLORIDE 0.9 % IJ SOLN: 9.0000 mL | INTRAMUSCULAR | Status: DC | PRN

## 2015-09-29 MED ORDER — EPHEDRINE 5 MG/ML INJ
INTRAVENOUS | Status: AC
  Filled 2015-09-29: qty 10

## 2015-09-29 MED ORDER — ALPRAZOLAM 0.5 MG PO TABS
1.0000 mg | ORAL_TABLET | Freq: Every evening | ORAL | Status: DC | PRN
  Administered 2015-09-29: 1 mg via ORAL
  Filled 2015-09-29: qty 2

## 2015-09-29 MED ORDER — ESMOLOL HCL 10 MG/ML IV SOLN
INTRAVENOUS | Status: DC | PRN
  Administered 2015-09-29: 5 mg via INTRAVENOUS

## 2015-09-29 MED ORDER — LIDOCAINE HCL (CARDIAC) 20 MG/ML IV SOLN
INTRAVENOUS | Status: AC
  Filled 2015-09-29: qty 5

## 2015-09-29 MED ORDER — MENTHOL 3 MG MT LOZG: 1.0000 | LOZENGE | OROMUCOSAL | Status: DC | PRN

## 2015-09-29 MED ORDER — DEXAMETHASONE SODIUM PHOSPHATE 10 MG/ML IJ SOLN
INTRAMUSCULAR | Status: DC | PRN
  Administered 2015-09-29: 4 mg via INTRAVENOUS

## 2015-09-29 MED ORDER — ACETAMINOPHEN 10 MG/ML IV SOLN
1000.0000 mg | Freq: Once | INTRAVENOUS | Status: AC
  Administered 2015-09-29: 1000 mg via INTRAVENOUS
  Filled 2015-09-29: qty 100

## 2015-09-29 MED ORDER — ROCURONIUM BROMIDE 100 MG/10ML IV SOLN
INTRAVENOUS | Status: DC | PRN
  Administered 2015-09-29: 50 mg via INTRAVENOUS

## 2015-09-29 MED ORDER — LACTATED RINGERS IV SOLN
INTRAVENOUS | Status: DC
  Administered 2015-09-29: 07:00:00 via INTRAVENOUS

## 2015-09-29 MED ORDER — NEOSTIGMINE METHYLSULFATE 10 MG/10ML IV SOLN
INTRAVENOUS | Status: AC
  Filled 2015-09-29: qty 1

## 2015-09-29 MED ORDER — MIDAZOLAM HCL 2 MG/2ML IJ SOLN
INTRAMUSCULAR | Status: AC
  Filled 2015-09-29: qty 4

## 2015-09-29 MED ORDER — KETOROLAC TROMETHAMINE 30 MG/ML IJ SOLN
INTRAMUSCULAR | Status: AC
  Filled 2015-09-29: qty 1

## 2015-09-29 MED ORDER — PROPOFOL 10 MG/ML IV BOLUS
INTRAVENOUS | Status: DC | PRN
  Administered 2015-09-29: 160 mg via INTRAVENOUS

## 2015-09-29 MED ORDER — BUPIVACAINE HCL (PF) 0.25 % IJ SOLN
INTRAMUSCULAR | Status: DC | PRN
  Administered 2015-09-29: 4 mL

## 2015-09-29 MED ORDER — FENTANYL CITRATE (PF) 100 MCG/2ML IJ SOLN
INTRAMUSCULAR | Status: DC | PRN
  Administered 2015-09-29: 100 ug via INTRAVENOUS
  Administered 2015-09-29 (×3): 50 ug via INTRAVENOUS
  Administered 2015-09-29: 100 ug via INTRAVENOUS

## 2015-09-29 MED ORDER — ESCITALOPRAM OXALATE 10 MG PO TABS
10.0000 mg | ORAL_TABLET | Freq: Every day | ORAL | Status: DC
  Administered 2015-09-29: 10 mg via ORAL
  Filled 2015-09-29 (×2): qty 1

## 2015-09-29 MED ORDER — LEVOTHYROXINE SODIUM 100 MCG PO TABS
100.0000 ug | ORAL_TABLET | Freq: Every day | ORAL | Status: DC
  Administered 2015-09-30: 100 ug via ORAL
  Filled 2015-09-29 (×2): qty 1

## 2015-09-29 MED ORDER — IBUPROFEN 600 MG PO TABS
600.0000 mg | ORAL_TABLET | Freq: Four times a day (QID) | ORAL | Status: DC | PRN
  Administered 2015-09-30: 600 mg via ORAL
  Filled 2015-09-29: qty 1

## 2015-09-29 MED ORDER — TOPIRAMATE 25 MG PO TABS
25.0000 mg | ORAL_TABLET | Freq: Every day | ORAL | Status: DC
  Administered 2015-09-29: 25 mg via ORAL
  Filled 2015-09-29 (×2): qty 1

## 2015-09-29 MED ORDER — NEOSTIGMINE METHYLSULFATE 10 MG/10ML IV SOLN
INTRAVENOUS | Status: DC | PRN
  Administered 2015-09-29: 3 mg via INTRAVENOUS

## 2015-09-29 MED ORDER — NALOXONE HCL 0.4 MG/ML IJ SOLN: 0.4000 mg | INTRAMUSCULAR | Status: DC | PRN

## 2015-09-29 MED ORDER — HYDROMORPHONE 0.3 MG/ML IV SOLN
INTRAVENOUS | Status: DC
  Administered 2015-09-29: 2.59 mg via INTRAVENOUS
  Administered 2015-09-29: 4 mg via INTRAVENOUS
  Administered 2015-09-29: 0.3 mg via INTRAVENOUS
  Administered 2015-09-30 (×2): 0.999 mg via INTRAVENOUS
  Filled 2015-09-29: qty 25

## 2015-09-29 MED ORDER — EPHEDRINE SULFATE 50 MG/ML IJ SOLN
INTRAMUSCULAR | Status: DC | PRN
  Administered 2015-09-29: 5 mg via INTRAVENOUS

## 2015-09-29 MED ORDER — PROMETHAZINE HCL 25 MG/ML IJ SOLN: 6.2500 mg | INTRAMUSCULAR | Status: DC | PRN

## 2015-09-29 MED ORDER — FENTANYL CITRATE (PF) 250 MCG/5ML IJ SOLN
INTRAMUSCULAR | Status: AC
  Filled 2015-09-29: qty 25

## 2015-09-29 MED ORDER — FENTANYL CITRATE (PF) 100 MCG/2ML IJ SOLN
INTRAMUSCULAR | Status: AC
  Filled 2015-09-29: qty 4

## 2015-09-29 MED ORDER — GLYCOPYRROLATE 0.2 MG/ML IJ SOLN
INTRAMUSCULAR | Status: AC
  Filled 2015-09-29: qty 3

## 2015-09-29 MED ORDER — ONDANSETRON HCL 4 MG/2ML IJ SOLN
INTRAMUSCULAR | Status: DC | PRN
  Administered 2015-09-29: 4 mg via INTRAVENOUS

## 2015-09-29 MED ORDER — PROPOFOL 10 MG/ML IV BOLUS
INTRAVENOUS | Status: AC
  Filled 2015-09-29: qty 20

## 2015-09-29 MED ORDER — BUPIVACAINE HCL (PF) 0.25 % IJ SOLN
INTRAMUSCULAR | Status: AC
  Filled 2015-09-29: qty 30

## 2015-09-29 SURGICAL SUPPLY — 42 items
BARRIER ADHS 3X4 INTERCEED (GAUZE/BANDAGES/DRESSINGS) IMPLANT
CABLE HIGH FREQUENCY MONO STRZ (ELECTRODE) IMPLANT
CLOTH BEACON ORANGE TIMEOUT ST (SAFETY) ×3 IMPLANT
CONT PATH 16OZ SNAP LID 3702 (MISCELLANEOUS) ×3 IMPLANT
COVER BACK TABLE 60X90IN (DRAPES) ×3 IMPLANT
DECANTER SPIKE VIAL GLASS SM (MISCELLANEOUS) ×3 IMPLANT
DRSG COVADERM PLUS 2X2 (GAUZE/BANDAGES/DRESSINGS) ×6 IMPLANT
DRSG OPSITE POSTOP 3X4 (GAUZE/BANDAGES/DRESSINGS) ×3 IMPLANT
DURAPREP 26ML APPLICATOR (WOUND CARE) ×3 IMPLANT
ELECT REM PT RETURN 9FT ADLT (ELECTROSURGICAL) ×3
ELECTRODE REM PT RTRN 9FT ADLT (ELECTROSURGICAL) ×1 IMPLANT
EVACUATOR SMOKE 8.L (FILTER) IMPLANT
GLOVE BIO SURGEON STRL SZ 6.5 (GLOVE) ×2 IMPLANT
GLOVE BIO SURGEONS STRL SZ 6.5 (GLOVE) ×1
GLOVE BIOGEL PI IND STRL 6.5 (GLOVE) ×1 IMPLANT
GLOVE BIOGEL PI IND STRL 7.0 (GLOVE) ×4 IMPLANT
GLOVE BIOGEL PI INDICATOR 6.5 (GLOVE) ×2
GLOVE BIOGEL PI INDICATOR 7.0 (GLOVE) ×8
HEMOSTAT SURGICEL 4X8 (HEMOSTASIS) ×3 IMPLANT
LEGGING LITHOTOMY PAIR STRL (DRAPES) ×3 IMPLANT
LIQUID BAND (GAUZE/BANDAGES/DRESSINGS) ×3 IMPLANT
NEEDLE INSUFFLATION 120MM (ENDOMECHANICALS) ×3 IMPLANT
NS IRRIG 1000ML POUR BTL (IV SOLUTION) ×3 IMPLANT
PACK LAVH (CUSTOM PROCEDURE TRAY) ×3 IMPLANT
PACK ROBOTIC GOWN (GOWN DISPOSABLE) ×3 IMPLANT
PAD POSITIONING PINK XL (MISCELLANEOUS) ×3 IMPLANT
SEALER TISSUE G2 CVD JAW 45CM (ENDOMECHANICALS) ×3 IMPLANT
SET IRRIG TUBING LAPAROSCOPIC (IRRIGATION / IRRIGATOR) IMPLANT
SOLUTION ELECTROLUBE (MISCELLANEOUS) IMPLANT
SUT VIC AB 0 CT1 18XCR BRD8 (SUTURE) ×2 IMPLANT
SUT VIC AB 0 CT1 36 (SUTURE) ×9 IMPLANT
SUT VIC AB 0 CT1 8-18 (SUTURE) ×4
SUT VIC AB 3-0 PS2 18 (SUTURE)
SUT VIC AB 3-0 PS2 18XBRD (SUTURE) IMPLANT
SUT VICRYL 0 TIES 12 18 (SUTURE) ×3 IMPLANT
SUT VICRYL 0 UR6 27IN ABS (SUTURE) ×3 IMPLANT
TOWEL OR 17X24 6PK STRL BLUE (TOWEL DISPOSABLE) ×6 IMPLANT
TRAY FOLEY CATH SILVER 14FR (SET/KITS/TRAYS/PACK) ×3 IMPLANT
TROCAR OPTI TIP 5M 100M (ENDOMECHANICALS) ×3 IMPLANT
TROCAR XCEL DIL TIP R 11M (ENDOMECHANICALS) ×3 IMPLANT
WARMER LAPAROSCOPE (MISCELLANEOUS) ×3 IMPLANT
WATER STERILE IRR 1000ML POUR (IV SOLUTION) ×3 IMPLANT

## 2015-09-29 NOTE — Anesthesia Procedure Notes (Signed)
Procedure Name: Intubation Date/Time: 09/29/2015 7:36 AM Performed by: Elgie Congo Pre-anesthesia Checklist: Patient identified, Emergency Drugs available, Suction available and Patient being monitored Patient Re-evaluated:Patient Re-evaluated prior to inductionOxygen Delivery Method: Circle system utilized Preoxygenation: Pre-oxygenation with 100% oxygen Intubation Type: IV induction Ventilation: Mask ventilation without difficulty Laryngoscope Size: Mac and 3 Grade View: Grade I Tube type: Oral Tube size: 7.0 mm Number of attempts: 1 Airway Equipment and Method: Stylet Placement Confirmation: ETT inserted through vocal cords under direct vision,  positive ETCO2 and breath sounds checked- equal and bilateral Secured at: 20 cm Tube secured with: Tape Dental Injury: Teeth and Oropharynx as per pre-operative assessment

## 2015-09-29 NOTE — Anesthesia Postprocedure Evaluation (Signed)
  Anesthesia Post-op Note  Patient: Desiree Bauer  Procedure(s) Performed: Procedure(s): LAPAROSCOPIC ASSISTED VAGINAL HYSTERECTOMY (Bilateral)  BILATERAL SALPINGO OOPHORECTOMY (Bilateral)  Patient Location: Mother/Baby  Anesthesia Type:General  Level of Consciousness: awake, alert  and oriented  Airway and Oxygen Therapy: Patient Spontanous Breathing and Patient connected to nasal cannula oxygen  Post-op Pain: none  Post-op Assessment: Post-op Vital signs reviewed and Patient's Cardiovascular Status Stable              Post-op Vital Signs: Reviewed and stable  Last Vitals:  Filed Vitals:   09/29/15 1508  BP: 99/58  Pulse: 86  Temp:   Resp: 19    Complications: No apparent anesthesia complications

## 2015-09-29 NOTE — Anesthesia Preprocedure Evaluation (Signed)
Anesthesia Evaluation  Patient identified by MRN, date of birth, ID band Patient awake    Reviewed: Allergy & Precautions, NPO status , Patient's Chart, lab work & pertinent test results  History of Anesthesia Complications Negative for: history of anesthetic complications  Airway Mallampati: I  TM Distance: >3 FB Neck ROM: Full    Dental  (+) Teeth Intact, Dental Advisory Given   Pulmonary PE   Pulmonary exam normal breath sounds clear to auscultation       Cardiovascular Exercise Tolerance: Good (-) hypertension(-) angina(-) Past MI negative cardio ROS Normal cardiovascular exam Rhythm:Regular Rate:Normal     Neuro/Psych  Headaches, PSYCHIATRIC DISORDERS Anxiety Depression    GI/Hepatic negative GI ROS, Neg liver ROS,   Endo/Other  Hypothyroidism   Renal/GU negative Renal ROS  Female GU complaint     Musculoskeletal negative musculoskeletal ROS (+)   Abdominal   Peds  Hematology negative hematology ROS (+)   Anesthesia Other Findings Day of surgery medications reviewed with the patient.  Reproductive/Obstetrics negative OB ROS                             Anesthesia Physical Anesthesia Plan  ASA: II  Anesthesia Plan: General   Post-op Pain Management:    Induction: Intravenous  Airway Management Planned: Oral ETT  Additional Equipment:   Intra-op Plan:   Post-operative Plan: Extubation in OR  Informed Consent: I have reviewed the patients History and Physical, chart, labs and discussed the procedure including the risks, benefits and alternatives for the proposed anesthesia with the patient or authorized representative who has indicated his/her understanding and acceptance.   Dental advisory given  Plan Discussed with: CRNA  Anesthesia Plan Comments: (Risks/benefits of general anesthesia discussed with patient including risk of damage to teeth, lips, gum, and tongue,  nausea/vomiting, allergic reactions to medications, and the possibility of heart attack, stroke and death.  All patient questions answered.  Patient wishes to proceed.)        Anesthesia Quick Evaluation

## 2015-09-29 NOTE — Brief Op Note (Signed)
09/29/2015  9:04 AM  PATIENT:  Desiree Bauer  44 y.o. female  PRE-OPERATIVE DIAGNOSIS:   Dysmenorrhea Menorrhagia  POST-OPERATIVE DIAGNOSIS:   Same  PROCEDURE:  Procedure(s): LAPAROSCOPIC ASSISTED VAGINAL HYSTERECTOMY (Bilateral)  BILATERAL SALPINGO OOPHORECTOMY (Bilateral)  SURGEON:  Surgeon(s) and Role:    * Marcelle Overlie, MD - Primary    * W Varney Baas, MD - Assisting  PHYSICIAN ASSISTANT:   ASSISTANTS: none   ANESTHESIA:   general  EBL:  Total I/O In: 1000 [I.V.:1000] Out: 250 [Urine:75; Blood:175]  BLOOD ADMINISTERED:none  DRAINS: Urinary Catheter (Foley)   LOCAL MEDICATIONS USED:  NONE  SPECIMEN:  Source of Specimen:  uterus, tubes, ovaries, cervix  DISPOSITION OF SPECIMEN:  PATHOLOGY  COUNTS:  YES  TOURNIQUET:  * No tourniquets in log *  DICTATION: .Other Dictation: Dictation Number [id number]  PLAN OF CARE: Admit to inpatient   PATIENT DISPOSITION:  PACU - hemodynamically stable.   Delay start of Pharmacological VTE agent (>24hrs) due to surgical blood loss or risk of bleeding: not applicable

## 2015-09-29 NOTE — Op Note (Signed)
NAMEMarland Kitchen  Desiree Bauer, Desiree Bauer NO.:  [id number]  MEDICAL RECORD NO.:  [id number]  LOCATION:  [facility]                          FACILITY:  [facility]  PHYSICIAN:  Jaylea Plourde L. Cerinity Zynda, M.D.DATE OF BIRTH:  Jun 23, 1971  DATE OF PROCEDURE:  09/29/2015 DATE OF DISCHARGE:                              OPERATIVE REPORT   PREOPERATIVE DIAGNOSES:  Dysmenorrhea and menorrhagia.  POSTOPERATIVE DIAGNOSES:  Dysmenorrhea and menorrhagia.  PROCEDURE:  Laparoscopic-assisted vaginal hysterectomy and bilateral salpingo-oophorectomy.  SURGEON:  Desiree Bauer, M.D.  ASSISTANT:  Dr. Leitha Schuller.  ESTIMATED BLOOD LOSS:  175 mL.  PATHOLOGY:  Uterus, cervix, tubes, and ovaries sent to Pathology.  COMPLICATIONS:  None.  DRAINS:  Foley catheter.  DESCRIPTION OF PROCEDURE:  The patient was taken to the operating room where she was prepped and draped after intubation.  She was then placed in a lithotomy position.  Uterine manipulator was inserted.  A Foley catheter was inserted and draining clear urine.  Attention was turned to the abdomen where a small incision was made at the umbilicus and the Veress needle was inserted and pneumoperitoneum was performed.  An 11 mm trocar was then inserted.  The scope was then introduced through the trocar sheath.  The abdomen and pelvis appeared normal.  The patient was placed in Trendelenburg.  A 5 mm port was placed suprapubically under direct visualization.  We then identified the tubes and ovaries.  I used a grasper to elevate the right tube and ovary and pull it away from the sidewall.  I placed the EnSeal just beneath the ovary with careful attention to avoid the ureter.  Cauterized that and transected the infundibulopelvic ligament and carried it down to the round ligament with very good hemostasis.  This was done on the right side and then done on the left side in identical fashion.  Both sides, the ureter was well out of our way from the EnSeal.   Hemostasis was very good.  We released the pneumoperitoneum, placed a weighted speculum in the vagina. The cervix was grasped with a tenaculum.  A circumferential incision was made around the cervix.  The posterior cul-de-sac was entered sharply using Mayo scissors.  The anterior cul-de-sac was entered sharply using Metzenbaum scissors.  Curved Heaney clamps were placed across each uterosacral-cardinal ligaments.  Each pedicle was clamped, cut, and suture ligated using 0 Vicryl suture.  Once we reached the level of the fundus, the uterus was retroflexed.  The remainder of the broad ligament was clamped on either side.  The uterus was removed and identified as cervix, tubes, uterus and ovaries.  The pedicles were inspected and noted to be hemostatic.  A figure-of-eight was placed at 3 and 9 o'clock.  The posterior cuff was closed in a running locked stitch, using 0 Vicryl suture.  The cuff was closed completely anterior to posterior in a running locked stitch.  We then went back up to the abdomen, replaced the pneumoperitoneum and irrigated the pelvis with __________.  One piece of Interceed was placed at the vaginal cuff to prevent adhesion formation in the future.  Hemostasis was very good.  We released the pneumoperitoneum, inspected the pedicles and the  pedicles were hemostatic.  The trocars were removed.  The infraumbilical site was closed with a 0 Vicryl stitch and then the skin was closed at both sites using Dermabond.  All sponge, lap, and instrument counts were correct x2.  Urine output was clear at the end of the case.     Desiree Bauer, M.D.     Desiree Bauer  D:  09/29/2015  T:  09/29/2015  Job:  [id number]

## 2015-09-29 NOTE — Progress Notes (Signed)
H and p on the chart No significant changes Will proceed with LAVH and BSO Consent signed

## 2015-09-29 NOTE — Anesthesia Postprocedure Evaluation (Signed)
  Anesthesia Post-op Note  Patient: Desiree Bauer  Procedure(s) Performed: Procedure(s) (LRB): LAPAROSCOPIC ASSISTED VAGINAL HYSTERECTOMY (Bilateral)  BILATERAL SALPINGO OOPHORECTOMY (Bilateral)  Patient Location: PACU  Anesthesia Type: General  Level of Consciousness: awake and alert   Airway and Oxygen Therapy: Patient Spontanous Breathing  Post-op Pain: mild  Post-op Assessment: Post-op Vital signs reviewed, Patient's Cardiovascular Status Stable, Respiratory Function Stable, Patent Airway and No signs of Nausea or vomiting  Last Vitals:  Filed Vitals:   09/29/15 1000  BP:   Pulse: 67  Temp:   Resp: 17    Post-op Vital Signs: stable   Complications: No apparent anesthesia complications

## 2015-09-29 NOTE — Transfer of Care (Signed)
Immediate Anesthesia Transfer of Care Note  Patient: Desiree Bauer  Procedure(s) Performed: Procedure(s): LAPAROSCOPIC ASSISTED VAGINAL HYSTERECTOMY (Bilateral)  BILATERAL SALPINGO OOPHORECTOMY (Bilateral)  Patient Location: PACU  Anesthesia Type:General  Level of Consciousness: awake, alert  and oriented  Airway & Oxygen Therapy: Patient Spontanous Breathing and Patient connected to nasal cannula oxygen  Post-op Assessment: Report given to RN and Post -op Vital signs reviewed and stable  Post vital signs: Reviewed and stable  Last Vitals:  Filed Vitals:   09/29/15 0607  BP: 110/70  Pulse: 73  Temp: 36.9 C  Resp: 18    Complications: No apparent anesthesia complications

## 2015-09-29 NOTE — Addendum Note (Signed)
Addendum  created 09/29/15 1614 by Elgie Congo, CRNA   Modules edited: Notes Section   Notes Section:  File: [id number]

## 2015-09-30 ENCOUNTER — Encounter (HOSPITAL_COMMUNITY): Payer: Self-pay | Admitting: Obstetrics and Gynecology

## 2015-09-30 LAB — CBC
HCT: 30.5 % — ABNORMAL LOW (ref 36.0–46.0)
HEMOGLOBIN: 10.2 g/dL — AB (ref 12.0–15.0)
MCH: 32.5 pg (ref 26.0–34.0)
MCHC: 33.4 g/dL (ref 30.0–36.0)
MCV: 97.1 fL (ref 78.0–100.0)
Platelets: 158 10*3/uL (ref 150–400)
RBC: 3.14 MIL/uL — ABNORMAL LOW (ref 3.87–5.11)
RDW: 12.6 % (ref 11.5–15.5)
WBC: 8 10*3/uL (ref 4.0–10.5)

## 2015-09-30 LAB — BASIC METABOLIC PANEL
ANION GAP: 6 (ref 5–15)
BUN: 7 mg/dL (ref 6–20)
CALCIUM: 8.1 mg/dL — AB (ref 8.9–10.3)
CO2: 25 mmol/L (ref 22–32)
Chloride: 108 mmol/L (ref 101–111)
Creatinine, Ser: 0.78 mg/dL (ref 0.44–1.00)
GFR calc Af Amer: >60 mL/min (ref >60)
GLUCOSE: 95 mg/dL (ref 65–99)
Potassium: 3.7 mmol/L (ref 3.5–5.1)
SODIUM: 139 mmol/L (ref 135–145)

## 2015-09-30 MED ORDER — IBUPROFEN 600 MG PO TABS: 600.0000 mg | ORAL_TABLET | Freq: Four times a day (QID) | ORAL | Status: DC | PRN

## 2015-09-30 MED ORDER — TRAMADOL HCL 50 MG PO TABS: 50.0000 mg | ORAL_TABLET | Freq: Four times a day (QID) | ORAL | Status: DC | PRN

## 2015-09-30 MED ORDER — HYDROCODONE-ACETAMINOPHEN 5-325 MG PO TABS: 1.0000 | ORAL_TABLET | ORAL | Status: DC | PRN

## 2015-09-30 MED ORDER — HYDROCODONE-ACETAMINOPHEN 5-325 MG PO TABS
1.0000 | ORAL_TABLET | ORAL | Status: DC | PRN
  Administered 2015-09-30 (×2): 1 via ORAL
  Filled 2015-09-30 (×2): qty 1

## 2015-09-30 NOTE — Discharge Summary (Signed)
  Admission Diagnosis: Menorrhagia Pelvic Pain  Discharge Diagnosis: Same  Hospital Course: 44 year old female admitted for LAVH and BSO She did very well post op and had Hemoglobin on POD #1 of 10.2   She was ambulating on POD #1 and had normal bowel and bladder function She was discharged home in good condition on POD  # 1 with Ibuprofen, Ultram and Vicodin She will follow up in 1 week She was given discharge instructions.  BP 79/42 mmHg  Pulse 64  Temp(Src) 98.2 F (36.8 C) (Oral)  Resp 18  Ht  (1.575 m)  Wt 61.689 kg (136 lb)  BMI 24.87 kg/m2  SpO2 100%  LMP 09/13/2015 Results for orders placed or performed during the hospital encounter of 09/29/15 (from the past 24 hour(s))  Basic metabolic panel     Status: Abnormal   Collection Time: 09/30/15  6:20 AM  Result Value Ref Range   Sodium 139 135 - 145 mmol/L   Potassium 3.7 3.5 - 5.1 mmol/L   Chloride 108 101 - 111 mmol/L   CO2 25 22 - 32 mmol/L   Glucose, Bld 95 65 - 99 mg/dL   BUN 7 6 - 20 mg/dL   Creatinine, Ser 1.61 0.44 - 1.00 mg/dL   Calcium 8.1 (L) 8.9 - 10.3 mg/dL   GFR calc non Af Amer >60 >60 mL/min   GFR calc Af Amer >60 >60 mL/min   Anion gap 6 5 - 15  CBC     Status: Abnormal   Collection Time: 09/30/15  6:20 AM  Result Value Ref Range   WBC 8.0 4.0 - 10.5 K/uL   RBC 3.14 (L) 3.87 - 5.11 MIL/uL   Hemoglobin 10.2 (L) 12.0 - 15.0 g/dL   HCT 09.6 (L) 04.5 - 40.9 %   MCV 97.1 78.0 - 100.0 fL   MCH 32.5 26.0 - 34.0 pg   MCHC 33.4 30.0 - 36.0 g/dL   RDW 81.1 91.4 - 78.2 %   Platelets 158 150 - 400 K/uL

## 2015-09-30 NOTE — Progress Notes (Signed)
Patient is voiding. Ate a good breakfast. Had flatus.  Pain is up and requesting vicodin.  BP 83/50 mmHg  Pulse 65  Temp(Src) 98.3 F (36.8 C) (Oral)  Resp 16  Ht  (1.575 m)  Wt 61.689 kg (136 lb)  BMI 24.87 kg/m2  SpO2 99%  LMP 09/13/2015 Results for orders placed or performed during the hospital encounter of 09/29/15 (from the past 24 hour(s))  Basic metabolic panel     Status: Abnormal   Collection Time: 09/30/15  6:20 AM  Result Value Ref Range   Sodium 139 135 - 145 mmol/L   Potassium 3.7 3.5 - 5.1 mmol/L   Chloride 108 101 - 111 mmol/L   CO2 25 22 - 32 mmol/L   Glucose, Bld 95 65 - 99 mg/dL   BUN 7 6 - 20 mg/dL   Creatinine, Ser 3.32 0.44 - 1.00 mg/dL   Calcium 8.1 (L) 8.9 - 10.3 mg/dL   GFR calc non Af Amer >60 >60 mL/min   GFR calc Af Amer >60 >60 mL/min   Anion gap 6 5 - 15  CBC     Status: Abnormal   Collection Time: 09/30/15  6:20 AM  Result Value Ref Range   WBC 8.0 4.0 - 10.5 K/uL   RBC 3.14 (L) 3.87 - 5.11 MIL/uL   Hemoglobin 10.2 (L) 12.0 - 15.0 g/dL   HCT 95.1 (L) 88.4 - 16.6 %   MCV 97.1 78.0 - 100.0 fL   MCH 32.5 26.0 - 34.0 pg   MCHC 33.4 30.0 - 36.0 g/dL   RDW 06.3 01.6 - 01.0 %   Platelets 158 150 - 400 K/uL   General alert and oriented Lung CTAB Car RRR Abdomen is soft and non tender Incisions are clean and dry  IMPRESSION: POD #1 LAVH and BSO Doing well Routine care vicodin Possible discharge later today if pain improved

## 2015-09-30 NOTE — Progress Notes (Signed)
Patient discharged home with significant other... Discharge instructions reviewed with patient and she verbalized understanding... Condition stable... No equipment... Taken to car via wheelchair by C. Riley, NT.  

## 2015-10-01 DIAGNOSIS — Z90710 Acquired absence of both cervix and uterus: Secondary | ICD-10-CM | POA: Diagnosis present

## 2016-07-28 ENCOUNTER — Other Ambulatory Visit: Payer: Self-pay | Admitting: Obstetrics and Gynecology

## 2016-07-28 DIAGNOSIS — E039 Hypothyroidism, unspecified: Secondary | ICD-10-CM

## 2016-08-02 ENCOUNTER — Ambulatory Visit
Admission: RE | Admit: 2016-08-02 | Discharge: 2016-08-02 | Disposition: A | Discharge status: Home / Self Care | Payer: 59 | Source: Ambulatory Visit | Attending: Obstetrics and Gynecology | Admitting: Obstetrics and Gynecology

## 2016-08-02 DIAGNOSIS — E039 Hypothyroidism, unspecified: Secondary | ICD-10-CM

## 2016-09-23 IMAGING — US US THYROID
1 series · 14 of 25 positions shown · non-contrast
Comparison: None.

CLINICAL DATA: 44-year-old female with a history of hypothyroidism

EXAM:
THYROID ULTRASOUND
TECHNIQUE: Ultrasound examination of the thyroid gland and adjacent soft
tissues was performed.

[Series 1: us thyroid · 0.05mm/px · 14 of 32 slices shown]
[im 1/32]
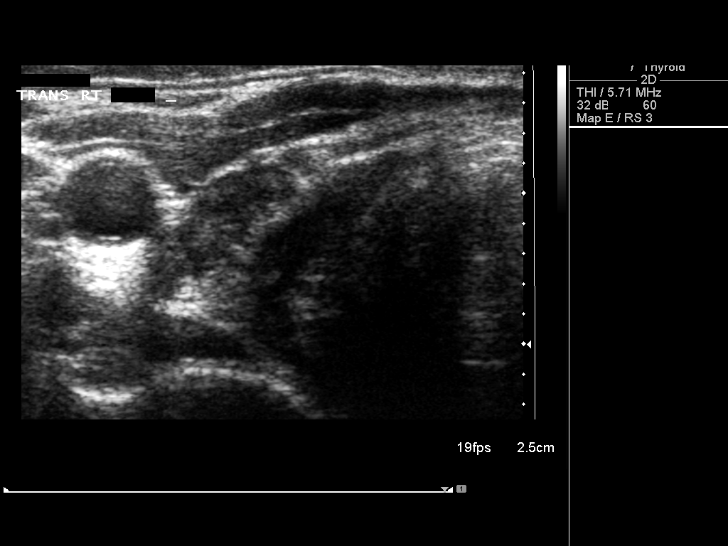
[im 3/32]
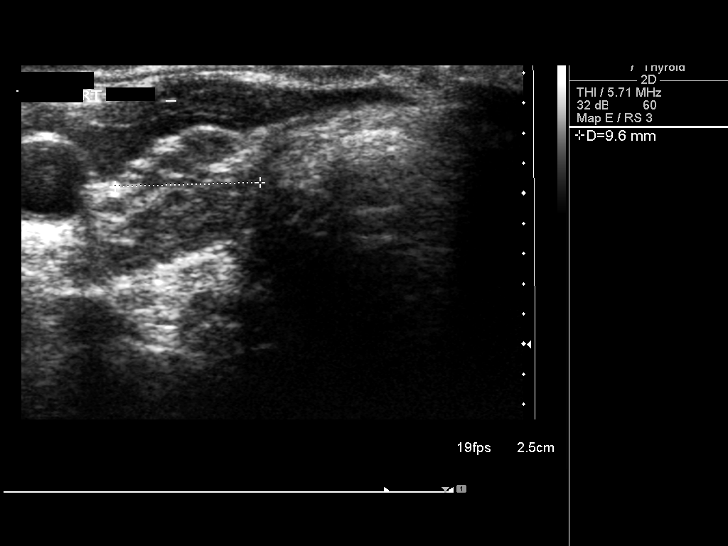
[im 6/32]
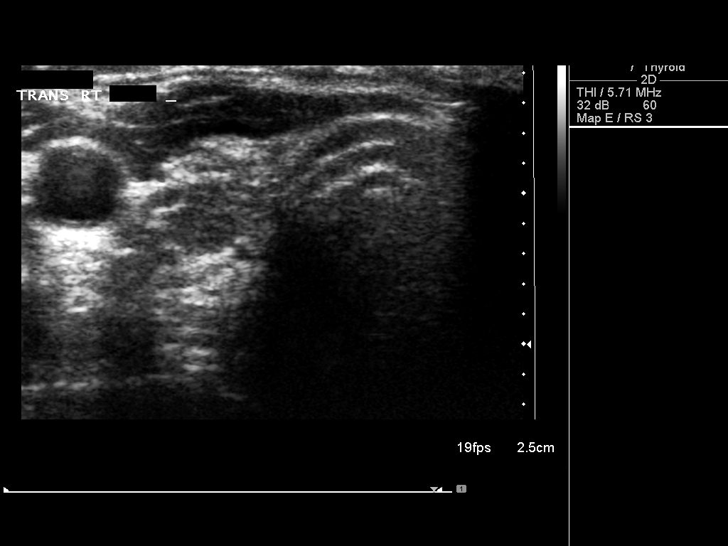
[im 8/32]
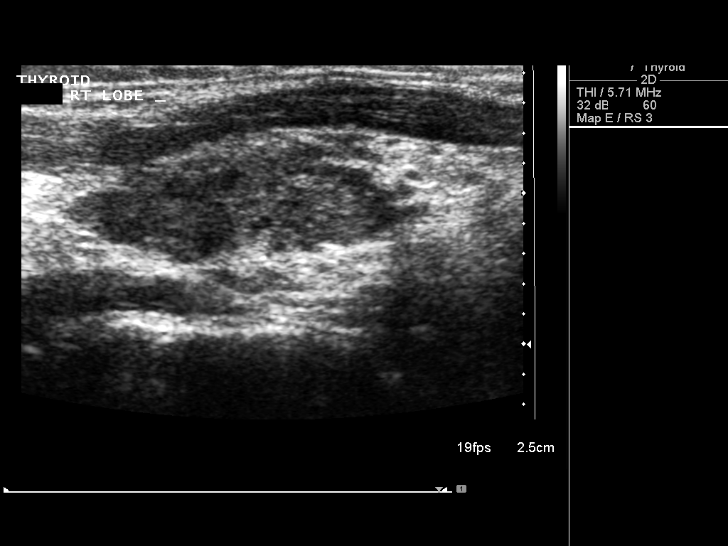
[im 11/32]
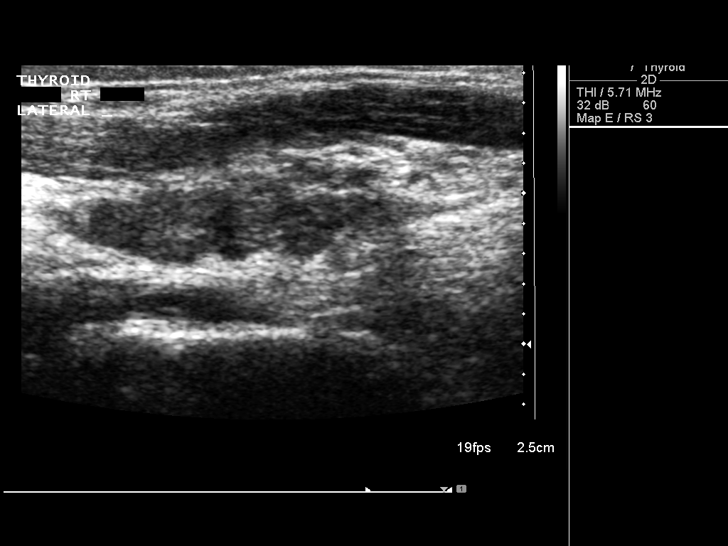
[im 12/32]
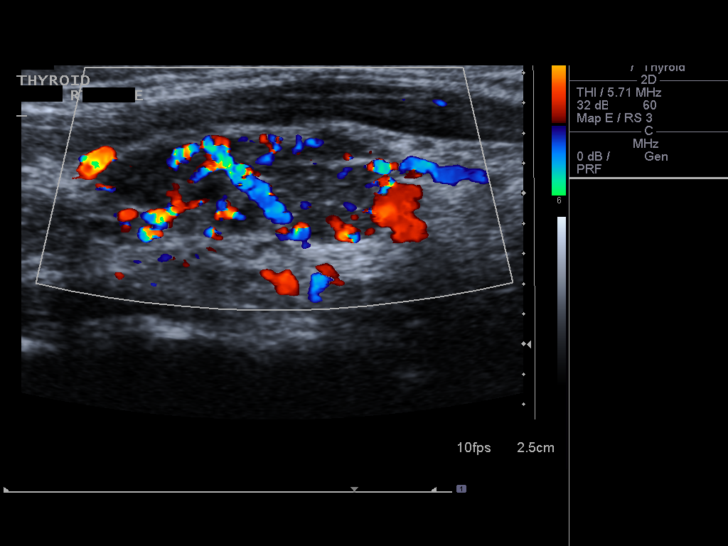
[im 15/32]
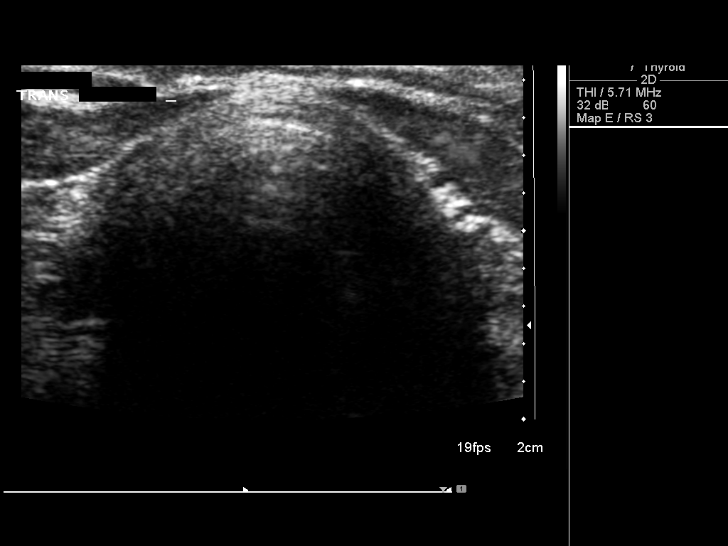
[im 17/32]
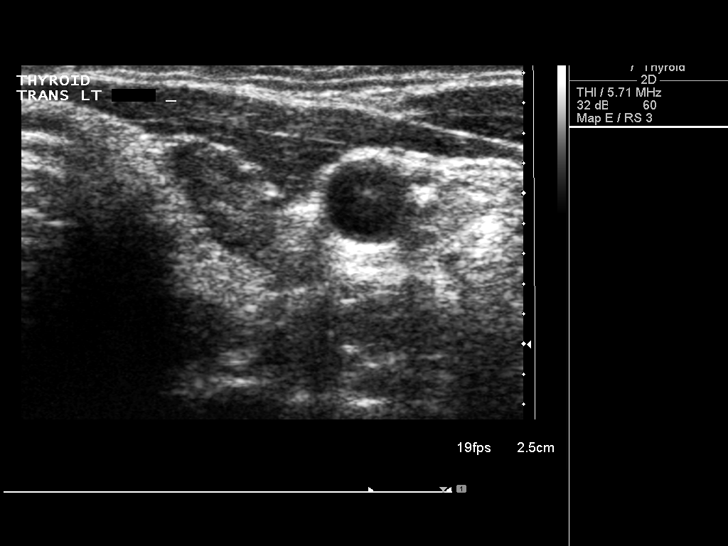
[im 20/32]
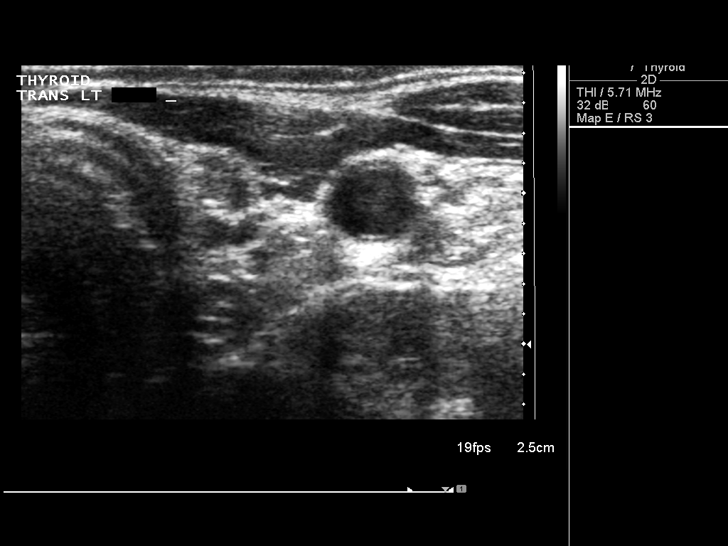
[im 21/32]
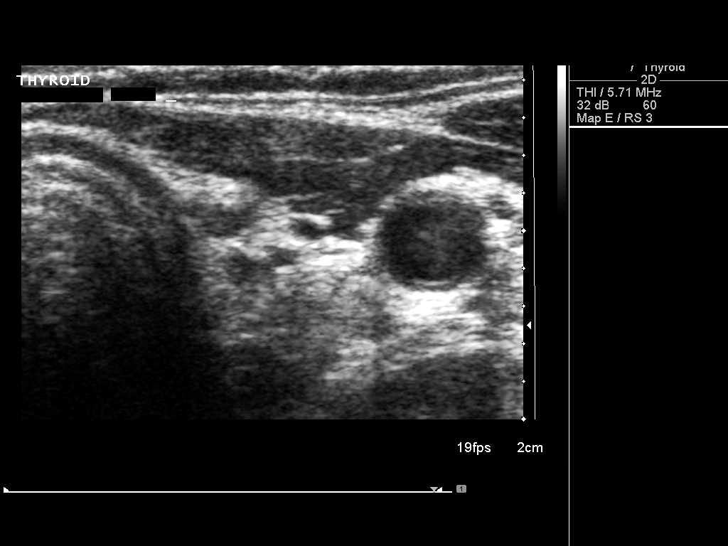
[im 24/32]
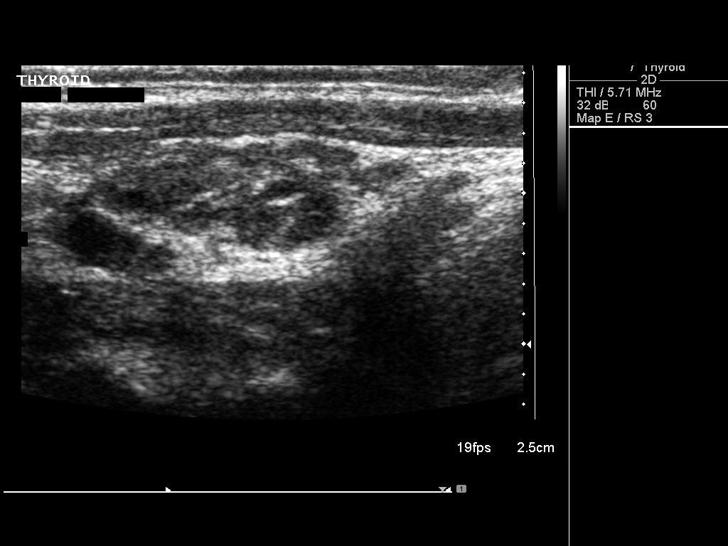
[im 26/32]
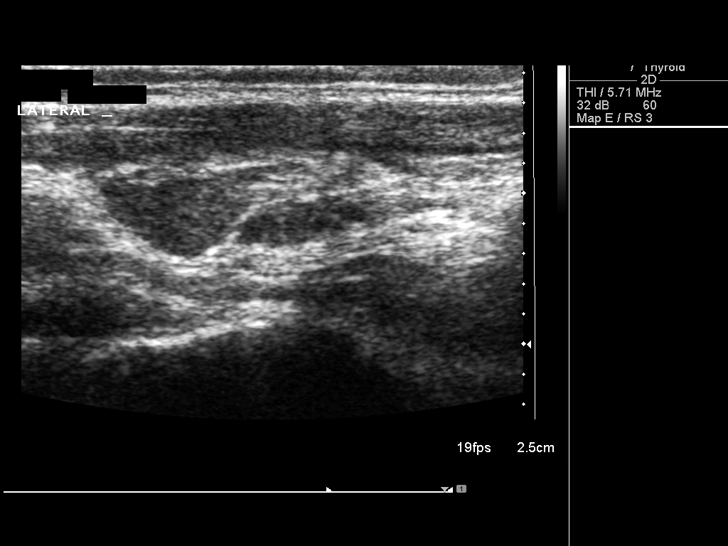
[im 29/32]
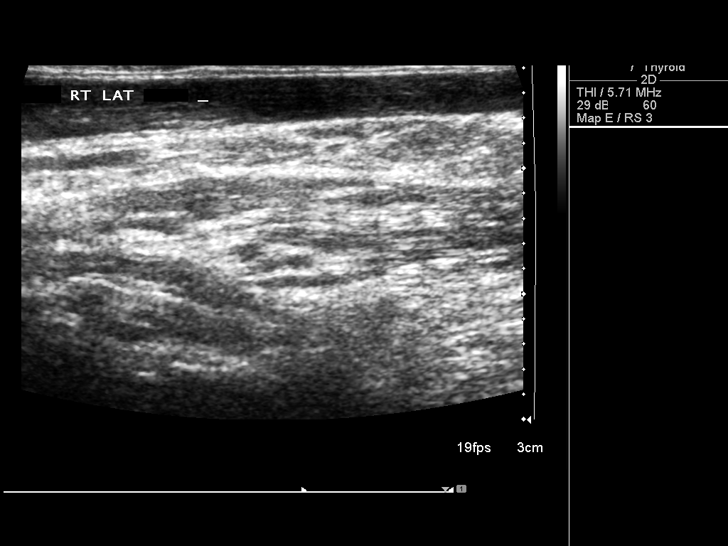
[im 32/32]
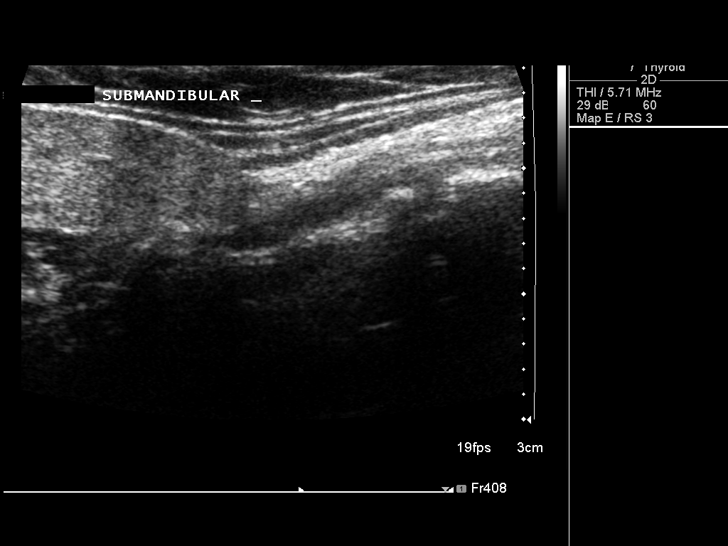

[14 of 25 positions shown; findings below may reference images not displayed]

FINDINGS: Right thyroid lobe

Measurements: 2.5 cm x 0.8 cm x 1.0 cm. Heterogeneously hypoechoic
right-sided thyroid tissue.

Left thyroid lobe

Measurements: 2.4 cm x 0.8 cm x 0.8 cm. Heterogeneously hypoechoic
left-sided thyroid tissue.

Isthmus

Thickness: 0.5 cm.  No nodules visualized.

Lymphadenopathy

None visualized.
IMPRESSION: Heterogeneously hypoechoic thyroid, compatible with medical thyroid
disease.

## 2016-10-26 DIAGNOSIS — E063 Autoimmune thyroiditis: Secondary | ICD-10-CM

## 2016-10-26 HISTORY — DX: Autoimmune thyroiditis: E06.3

## 2017-03-14 LAB — HM PAP SMEAR

## 2017-03-14 LAB — HM MAMMOGRAPHY

## 2017-07-06 ENCOUNTER — Telehealth: Payer: Self-pay | Admitting: Obstetrics and Gynecology

## 2017-07-06 NOTE — Telephone Encounter (Signed)
fyi

## 2017-07-06 NOTE — Telephone Encounter (Signed)
Please advise 

## 2017-07-06 NOTE — Telephone Encounter (Signed)
Ok  To reestablish as new patient

## 2017-07-06 NOTE — Telephone Encounter (Signed)
Pt has not seen dr Marvell Fullerwp in over 3 yrs. Pt kids carter Kendall and priscilla still sees dr Marvell Fullerwp. Pt mom would like to re-est. Can I sch?

## 2017-07-08 NOTE — Telephone Encounter (Signed)
Pt has been sch

## 2017-07-20 ENCOUNTER — Ambulatory Visit (INDEPENDENT_AMBULATORY_CARE_PROVIDER_SITE_OTHER): Payer: Managed Care, Other (non HMO) | Admitting: Internal Medicine

## 2017-07-20 ENCOUNTER — Encounter: Payer: Self-pay | Admitting: Internal Medicine

## 2017-07-20 VITALS — BP 110/80 | HR 84 | Temp 98.3°F | Ht 61.5 in | Wt 122.0 lb

## 2017-07-20 DIAGNOSIS — Z86711 Personal history of pulmonary embolism: Secondary | ICD-10-CM | POA: Diagnosis not present

## 2017-07-20 DIAGNOSIS — G43009 Migraine without aura, not intractable, without status migrainosus: Secondary | ICD-10-CM | POA: Diagnosis not present

## 2017-07-20 DIAGNOSIS — Z87440 Personal history of urinary (tract) infections: Secondary | ICD-10-CM

## 2017-07-20 DIAGNOSIS — R1013 Epigastric pain: Secondary | ICD-10-CM

## 2017-07-20 DIAGNOSIS — N809 Endometriosis, unspecified: Secondary | ICD-10-CM

## 2017-07-20 DIAGNOSIS — E8940 Asymptomatic postprocedural ovarian failure: Secondary | ICD-10-CM

## 2017-07-20 DIAGNOSIS — E063 Autoimmune thyroiditis: Secondary | ICD-10-CM | POA: Diagnosis not present

## 2017-07-20 DIAGNOSIS — N800 Endometriosis of uterus: Secondary | ICD-10-CM | POA: Insufficient documentation

## 2017-07-20 DIAGNOSIS — N8003 Adenomyosis of the uterus: Secondary | ICD-10-CM | POA: Insufficient documentation

## 2017-07-20 LAB — COMPREHENSIVE METABOLIC PANEL
ALBUMIN: 4.6 g/dL (ref 3.5–5.2)
ALT: 15 U/L (ref 0–35)
AST: 20 U/L (ref 0–37)
Alkaline Phosphatase: 52 U/L (ref 39–117)
BILIRUBIN TOTAL: 0.4 mg/dL (ref 0.2–1.2)
BUN: 13 mg/dL (ref 6–23)
CALCIUM: 9.6 mg/dL (ref 8.4–10.5)
CO2: 28 meq/L (ref 19–32)
CREATININE: 0.98 mg/dL (ref 0.40–1.20)
Chloride: 106 mEq/L (ref 96–112)
GFR: 64.42 mL/min (ref >60.00)
Glucose, Bld: 86 mg/dL (ref 70–99)
Potassium: 3.8 mEq/L (ref 3.5–5.1)
Sodium: 139 mEq/L (ref 135–145)
Total Protein: 7 g/dL (ref 6.0–8.3)

## 2017-07-20 MED ORDER — RANITIDINE HCL 150 MG PO TABS: 150.0000 mg | ORAL_TABLET | Freq: Two times a day (BID) | ORAL | 1 refills | Status: DC

## 2017-07-20 NOTE — Patient Instructions (Addendum)
Get copy of last 2 visit s and labs from dr Talmage NapBalan and dr Vincente PoliGrewal to help  Plan evaluation.   Look at  wellbutrin   If 12 hour or 24 hours   And dosing.   Not that great for anxiety    But good for depression and other mood issues .   Probably need  More eval of stomach issues   With GI doc  Dr Juanda Chancebrodie did your last   Colon we can refer again as she is retired .  Lab ultrasound   Of liver gall bladder    Will be contacted   Minimize the advil type medication  Incase  Gastritis caususing sx .  Track migraine   To help control such as migraine buddy.   In the interim if not done take ranitidine 150 mg 1 twice a day    For acid blocking in case gastritis .    Lab today and plan fu visit in 1 month depending on   Progress evaluation.

## 2017-07-20 NOTE — Progress Notes (Signed)
Chief Complaint  Patient presents with  . [name]    HPI: Patient  Desiree Bauer  46 y.o. comes in today to reestablish  Because she is having a tough time with abdominal pain. She states she's had a problem ever since she had a laparoscopic hysterectomy for adenomyosis ENT months ago or thereabouts. She states that she has high epigastric abdominal pain became be severe in gets and not after she eats meat sometimes chicken. There is no vomiting some nausea no change in bowel habits. Dr. Wallace Cullens well was trying to help evaluate but advised that she see a gastroenterologist. So Desiree Bauer with primary care before next step. Last ov  With [place]  was  2011 and  Dr Vincente Poli    Acted like    Her pcp   For convenience  Has had  Pe with infarction after rx with opcps for ovarian cyst  May 2015 had lap vag hsyt 2016  and has been  Adenomyosis  Severe  Hot flushes is better  .    But had   Surgical menopause   Recurrent UTIS       Has seen urologist and colonoscopy.       Last  [place] Peers last year  Had episode    Rock  In my throat.   ? Parathyroid.   [place].   Thyroid and to follow dr Talmage Nap   Stomach not  Better   Meat  And chickencause  knot s in stomach   And .    Bad attacks pain .   On going  Migraines middle of night    Inc dose    topamax .  50 mg recnetly     Still wild frequency of headaches   Was on lexapro for a while   And then switch over    To wellbutrin   .  Straight swith about a few months ago  alprazalam  At night .  Every night [per dr Reece Agar to "shut off her brain" and hep her sleep     Health Maintenance  Topic Date Due  . TETANUS/TDAP  09/16/1988  . HIV Screening  07/20/2018 (Originally 09/16/1984)  . INFLUENZA VACCINE  07/27/2017  . PAP SMEAR  02/12/2018   Health Maintenance Review LIFESTYLE:  Exercise:   Work out .    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 GI /GU: No adominal pain, vomiting, change in bowel habits. No blood in the stool. No significant GU symptoms. SKIN/HEME: ,no acute skin rashes suspicious lesions or bleeding. No lymphadenopathy, nodules, masses.  NEURO/ PSYCH:  No neurologic signs such as weakness numbness. No depression anxiety. IMM/ Allergy: No unusual infections.  Allergy .   REST of 12 system review negative except as per HPI   Past Medical History:  Diagnosis Date  . Acute pulmonary embolism (HCC) 06/09/2014  . Allergy   . Anxiety   . Chronic kidney disease    kidney  stone in left kidney   . Chronic migraine   . Depression   . Hypothyroidism   . Ruptured ovarian cyst   . SHINGLES 09/26/2009   Qualifier: Diagnosis of  By: Clent RidgesFry MD, Tera MaterStephen A     Past Surgical History:  Procedure Laterality Date  . BREAST ENHANCEMENT SURGERY    . BREAST SURGERY    . FOOT SURGERY Bilateral    for plantar faciitis  . LAPAROSCOPIC ASSISTED VAGINAL HYSTERECTOMY Bilateral 09/29/2015   Procedure: LAPAROSCOPIC ASSISTED VAGINAL HYSTERECTOMY;  Surgeon: Marcelle OverlieMichelle Grewal, MD;  Location: [facility];  Service: Gynecology;  Laterality: Bilateral;  . LAPAROSCOPIC BILATERAL SALPINGO OOPHERECTOMY Bilateral 09/29/2015   Procedure:  BILATERAL SALPINGO OOPHORECTOMY;  Surgeon: Marcelle OverlieMichelle Grewal, MD;  Location: [facility];  Service: Gynecology;  Laterality: Bilateral;  . mortens neuroma removed    . uterine ablation    . WISDOM TOOTH EXTRACTION      Family History  Problem Relation Age of Onset  . Multiple sclerosis Mother   . Endometrial cancer Mother   . Thyroid disease Mother   . Thyroid disease Father   .  Hypertension Father   . Stroke Maternal Grandmother   . Rheum arthritis Maternal Grandmother   . Diabetes Paternal Uncle   . Colon cancer Neg Hx   . Throat cancer Neg Hx   . Heart disease Neg Hx   . Liver disease Neg Hx   . Kidney disease Neg Hx     Social History   Social History  . Marital status: Significant Other    Spouse name: N/A  . Number of children: N/A  . Years of education: N/A   Occupational History  . Sales    Social History Main Topics  . Smoking status: Never Smoker  . Smokeless tobacco: Never Used  . Alcohol use Yes     Comment: 1-2 drinks daily  . Drug use: No  . Sexual activity: Yes     Comment: Ablation   Other Topics Concern  . None   Social History Narrative   hh of 6 temporary  2 children divorced  To be remarried in fall 18    Ship brokerMembership director a [employer]    g2 p2         EXAM:  BP 110/80 (BP Location: Left Arm, Patient Position: Sitting, Cuff Size: Normal)   Pulse 84   Temp 98.3 F (36.8 C) (Oral)   Ht 5' 1.5" (1.562 m)   Wt 122 lb (55.3 kg)   BMI 22.68 kg/m   Body mass index is 22.68 kg/m. Wt Readings from Last 3 Encounters:  07/20/17 122 lb (55.3 kg)  09/29/15 136 lb (61.7 kg)  09/24/15 136 lb (61.7 kg)    Physical Exam: Vital signs reviewed NFA:OZHYGEN:This is a well-developed well-nourished alert cooperative    who appearsr stated age in no acute distress.  HEENT: normocephalic atraumatic , Eyes: PERRL EOM's full, conjunctiva clear, Nares: paten,t no deformity discharge or tenderness., Ears: no deformity EAC's clear TMs with normal landmarks. NECK: supple without masses,  or bruits. CHEST/PULM:  Clear to auscultation and percussion breath sounds equal no wheeze , rales or rhonchi. No chest wall deformities or tenderness. Breast: normal by inspection . No dimpling, discharge, masses, tenderness or discharge . CV: PMI is nondisplaced, S1 S2 no gallops, murmurs, rubs. Peripheral pulses are full without delay.No JVD .  ABDOMEN: Bowel  sounds normal nontender  No guard or rebound, no hepato splenomegal no CVA tenderness.  No hernia.  Felt  standing but point to epigastric area  Extremtities:  No clubbing cyanosis or edema, no acute joint swelling or redness no focal atrophy NEURO:  Oriented x3, cranial nerves 3-12 appear to be intact, no obvious focal weakness,gait within normal limits no abnormal reflexes or asymmetrical SKIN: No acute rashes normal turgor, color, no bruising or petechiae. PSYCH: Oriented, good eye contact, no obvious depression anxiety, cognition and judgment appear normal. LN: no cervicaladenopathy  BP Readings from Last 3 Encounters:  07/20/17 110/80  09/30/15 (!) 79/42  09/24/15 120/76    Lab results reviewed with patient   ASSESSMENT AND PLAN:  Discussed the following assessment and plan:  Epigastric pain - Plan: CBC with Differential/Platelet, Comprehensive metabolic panel, Sedimentation rate, Celiac Disease Comprehensive Panel with Reflexes, US Abdomen Complete  Autoimmune thyroiditis  Surgical menopause - hx adenomyosis  abd surgery   History of pulmonary embolism - on ocps   Migraine without aura and without status migrainosus, not intractable - on topamax suppression per dr Reece AgarG  track for trigger  assessm,ent  History of recurrent UTIs Overall she relates her GI problem abdominal pain postprandial 2 after surgery and since then never had a problem before her abdominal surgery. Discussed potential diagnoses and causes. At this point there are no alarm symptoms. I agree with referral to gastroenterology will do abdominal ultrasound to look at her biliary tract have her minimize Advil Aleve products and add ranitidine date of she is are these probiotics without help. Consider further imaging.   Etc  Get copy of last   2 notes and labs . Gyne and endo    ? Next fu   Labs was ok on 88 dose   I don't think the Wellbutrin is helping her anxiety that much and it's interesting that she did a  straight switch from Lexapro to Wellbutrin. Will have her send in information about which Wellbutrin preparation she is using.  Patient Care Team: Madelin HeadingsPanosh, Ronen Bromwell K, MD as PCP - General (Internal Medicine) Patient Instructions  Get copy of last 2 visit s and labs from dr Talmage NapBalan and dr Vincente PoliGrewal to help  Plan evaluation.   Look at  wellbutrin   If 12 hour or 24 hours   And dosing.   Not that great for anxiety    But good for depression and other mood issues .   Probably need  More eval of stomach issues   With GI doc  Dr Juanda Chancebrodie did your last   Colon we can refer again as she is retired .  Lab ultrasound   Of liver gall bladder    Will be contacted   Minimize the advil type medication  Incase  Gastritis caususing sx .  Track migraine   To help control such as migraine buddy.   In the interim if not done take ranitidine 150 mg 1 twice a day    For acid blocking in case gastritis .    Lab today and plan fu visit in 1 month depending on   Progress evaluation.     Neta MendsWanda K. Syria Kestner M.D. Allergies as of 07/20/2017      Reactions   Ampicillin Other (See Comments)   Childhood/family    Erythromycin Nausea And Vomiting   Oxycodone Nausea Only, Other (See Comments)   Became dehydrated   Penicillins Other (See Comments)   Childhood/family reaction   Sulfonamide Derivatives Other (See Comments)   Dizziness; ringing in ears      Medication List  Accurate as of 07/20/17  4:31 PM. Always use your most recent med list.          ALPRAZolam 1 MG tablet Commonly known as:  XANAX Take 1 mg by mouth at bedtime as needed for sleep.   buPROPion 150 MG 12 hr tablet Commonly known as:  WELLBUTRIN SR   ibuprofen 600 MG tablet Commonly known as:  ADVIL,MOTRIN Take 1 tablet (600 mg total) by mouth every 6 (six) hours as needed (mild pain).   levothyroxine 88 MCG tablet Commonly known as:  SYNTHROID, LEVOTHROID   ranitidine 150 MG tablet Commonly known as:  ZANTAC Take 1 tablet (150 mg  total) by mouth 2 (two) times daily.   topiramate 50 MG tablet Commonly known as:  TOPAMAX

## 2017-07-21 LAB — CBC WITH DIFFERENTIAL/PLATELET
BASOS ABS: 0 10*3/uL (ref 0.0–0.1)
Basophils Relative: 0.5 % (ref 0.0–3.0)
Eosinophils Absolute: 0.1 10*3/uL (ref 0.0–0.7)
Eosinophils Relative: 2.3 % (ref 0.0–5.0)
HEMATOCRIT: 41.4 % (ref 36.0–46.0)
Hemoglobin: 13.9 g/dL (ref 12.0–15.0)
LYMPHS PCT: 42.3 % (ref 12.0–46.0)
Lymphs Abs: 1.7 10*3/uL (ref 0.7–4.0)
MCHC: 33.6 g/dL (ref 30.0–36.0)
MCV: 97.5 fl (ref 78.0–100.0)
MONOS PCT: 7.5 % (ref 3.0–12.0)
Monocytes Absolute: 0.3 10*3/uL (ref 0.1–1.0)
NEUTROS PCT: 47.4 % (ref 43.0–77.0)
Neutro Abs: 1.9 10*3/uL (ref 1.4–7.7)
Platelets: 272 10*3/uL (ref 150.0–400.0)
RBC: 4.25 Mil/uL (ref 3.87–5.11)
RDW: 13.1 % (ref 11.5–15.5)
WBC: 4 10*3/uL (ref 4.0–10.5)

## 2017-07-21 LAB — CELIAC DISEASE COMPREHENSIVE PANEL WITH REFLEXES
IgA: 229 mg/dL (ref 81–463)
Tissue Transglutaminase Ab, IgA: 1 U/mL (ref <4)

## 2017-07-22 ENCOUNTER — Other Ambulatory Visit: Payer: Managed Care, Other (non HMO)

## 2017-07-22 DIAGNOSIS — R1013 Epigastric pain: Secondary | ICD-10-CM

## 2017-07-22 NOTE — Addendum Note (Signed)
Addended by: Bonnye FavaKWEI, NANA K on: 07/22/2017 10:36 AM   Modules accepted: Orders

## 2017-07-27 ENCOUNTER — Encounter: Payer: Self-pay | Admitting: Internal Medicine

## 2017-07-28 ENCOUNTER — Other Ambulatory Visit: Payer: Managed Care, Other (non HMO)

## 2017-07-28 ENCOUNTER — Encounter: Payer: Self-pay | Admitting: Internal Medicine

## 2017-07-29 ENCOUNTER — Ambulatory Visit
Admission: RE | Admit: 2017-07-29 | Discharge: 2017-07-29 | Disposition: A | Discharge status: Home / Self Care | Payer: Managed Care, Other (non HMO) | Source: Ambulatory Visit | Attending: Internal Medicine | Admitting: Internal Medicine

## 2017-07-29 ENCOUNTER — Telehealth: Payer: Self-pay | Admitting: Internal Medicine

## 2017-07-29 DIAGNOSIS — R1013 Epigastric pain: Secondary | ICD-10-CM

## 2017-07-29 NOTE — Telephone Encounter (Signed)
FYI

## 2017-07-29 NOTE — Telephone Encounter (Signed)
Pt would like for Dr. Fabian SharpPanosh know that she has had her US done today.

## 2017-09-14 ENCOUNTER — Other Ambulatory Visit: Payer: Self-pay | Admitting: Internal Medicine

## 2017-09-16 ENCOUNTER — Encounter: Payer: Self-pay | Admitting: Internal Medicine

## 2017-09-22 ENCOUNTER — Other Ambulatory Visit: Payer: Self-pay | Admitting: Emergency Medicine

## 2017-09-22 MED ORDER — RANITIDINE HCL 150 MG PO TABS: 150.0000 mg | ORAL_TABLET | Freq: Two times a day (BID) | ORAL | 1 refills | Status: DC

## 2017-11-25 ENCOUNTER — Other Ambulatory Visit: Payer: Self-pay | Admitting: Internal Medicine

## 2018-01-05 IMAGING — US US ABDOMEN COMPLETE
1 series · 14 of 25 positions shown · non-contrast
Comparison: Radiography 06/16/2016.  CT 05/17/2014.

CLINICAL DATA: Epigastric pain after eating. Symptoms of several
years duration.

EXAM:
ABDOMEN ULTRASOUND COMPLETE

[Series 1: us abdomen complete · 0.12mm/px · 14 of 98 slices shown]
[im 1/98]
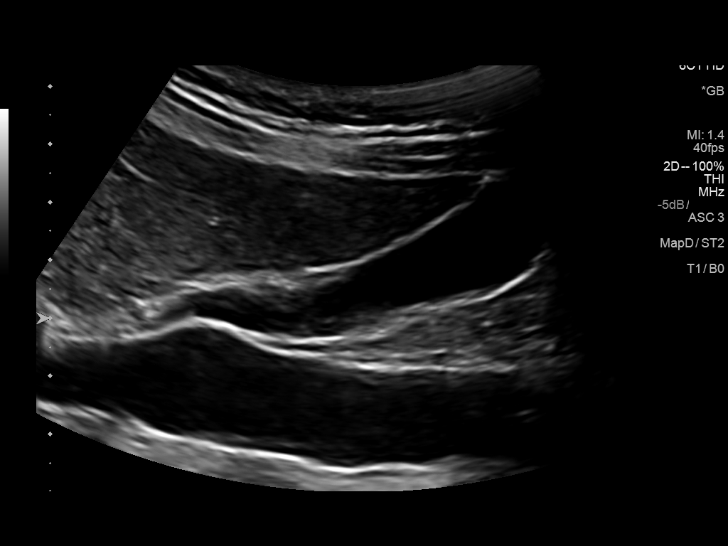
[im 9/98]
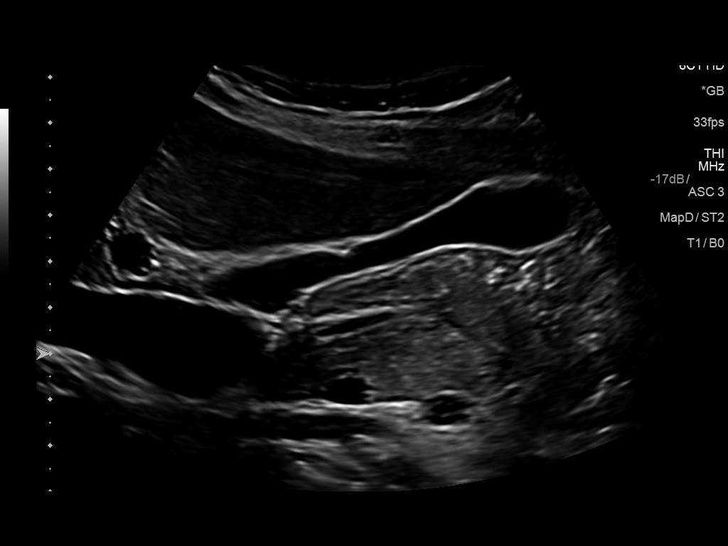
[im 17/98]
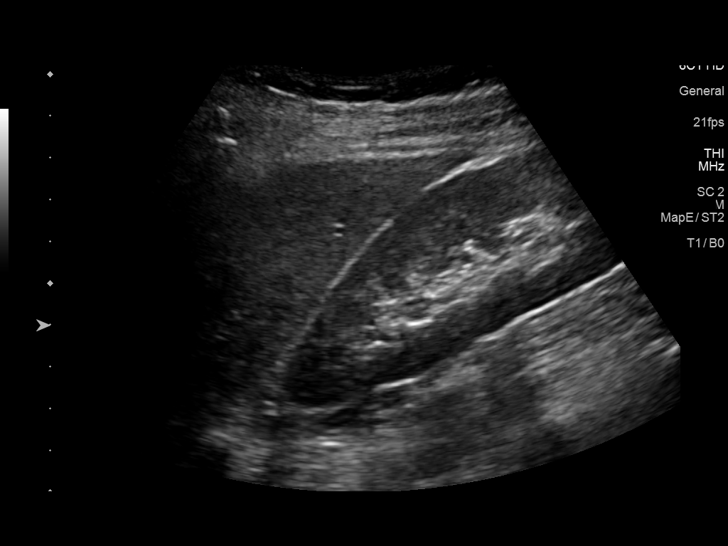
[im 25/98]
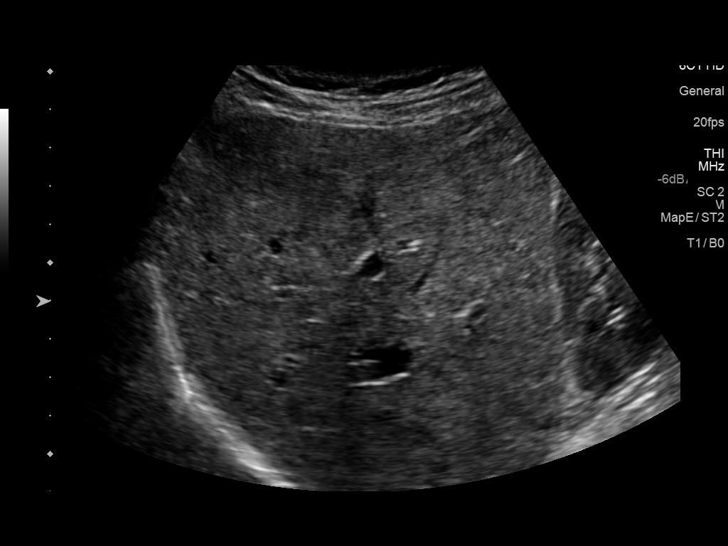
[im 33/98]
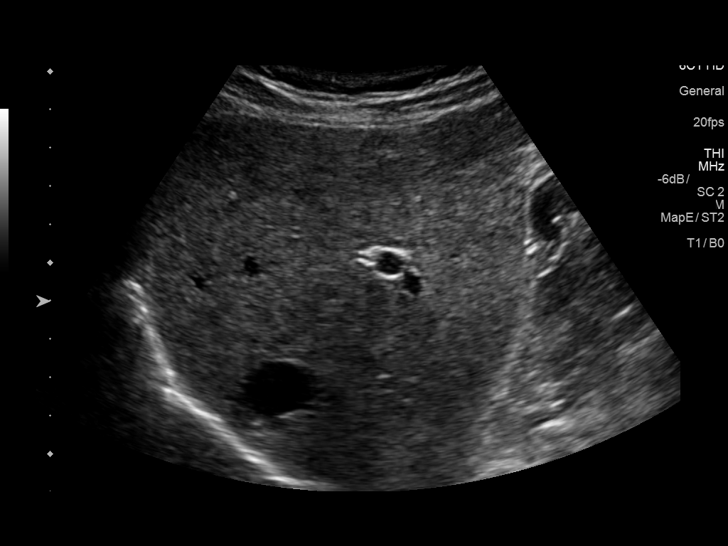
[im 37/98]
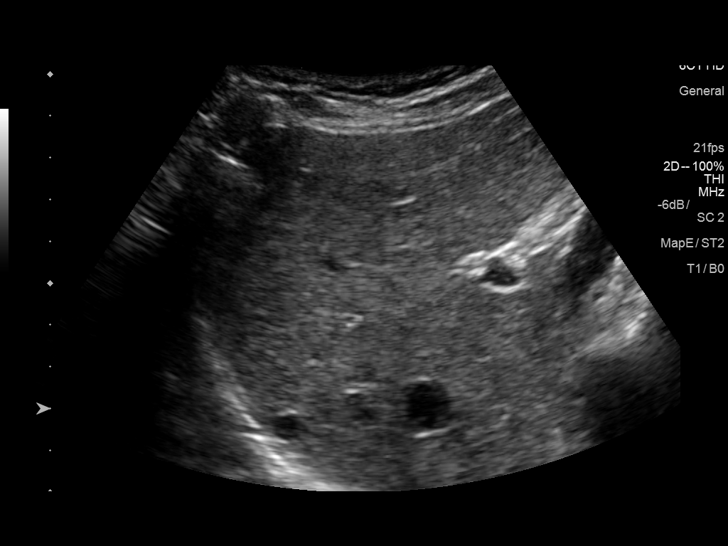
[im 45/98]
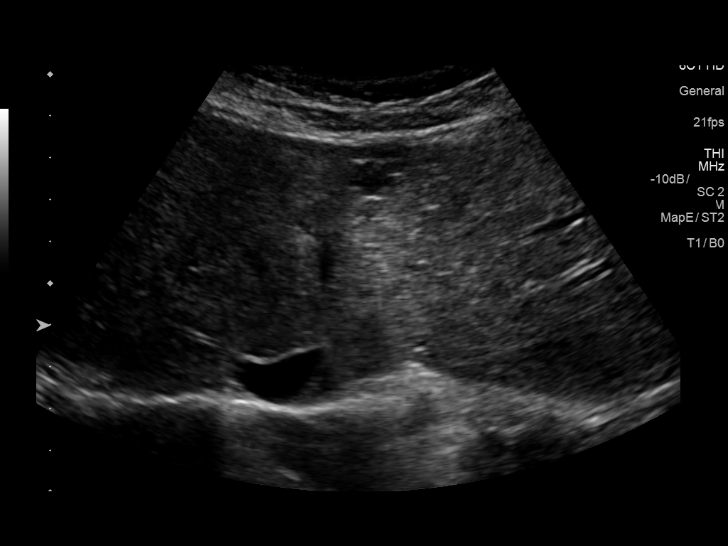
[im 53/98]
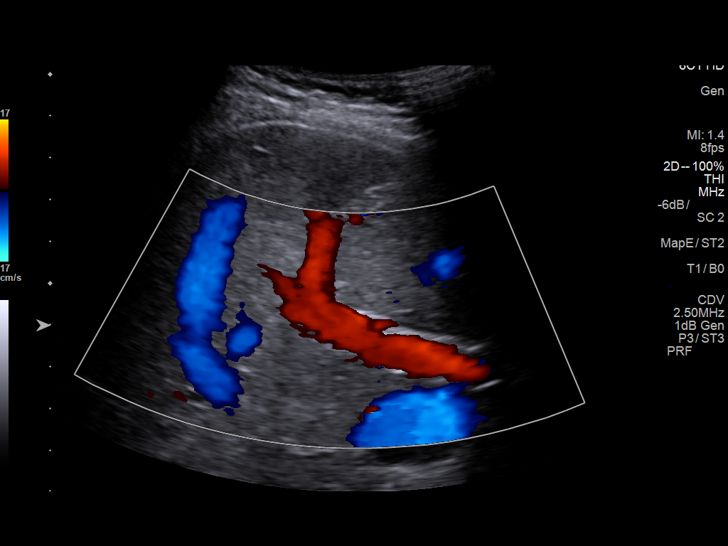
[im 61/98]
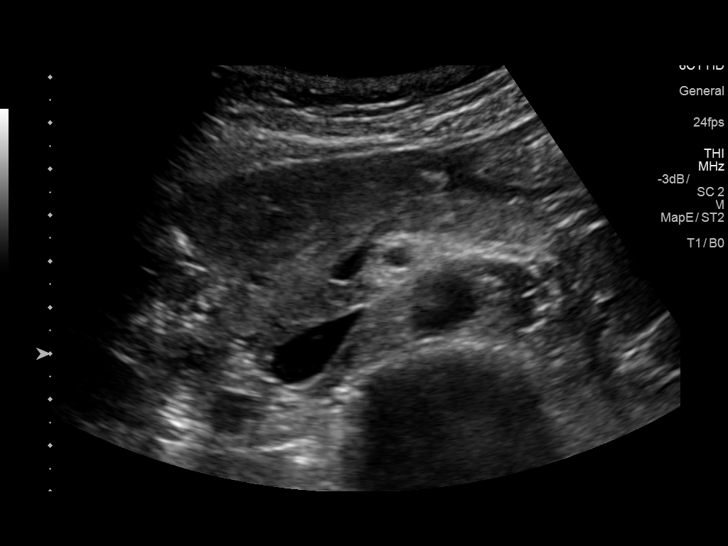
[im 65/98]
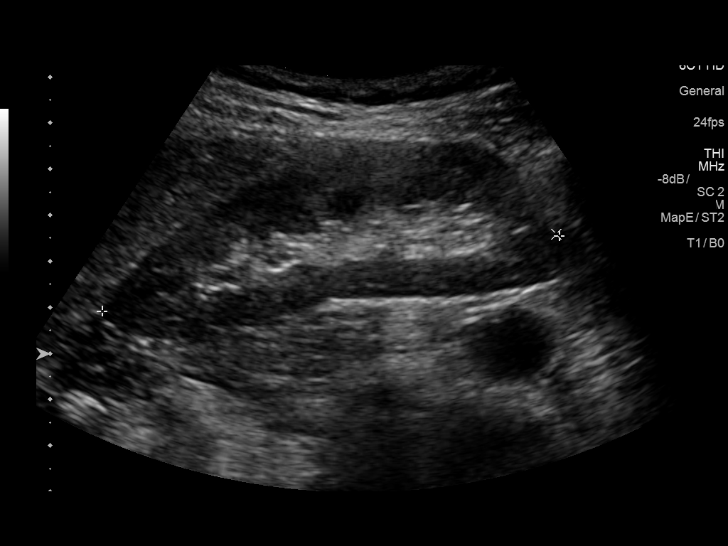
[im 73/98]
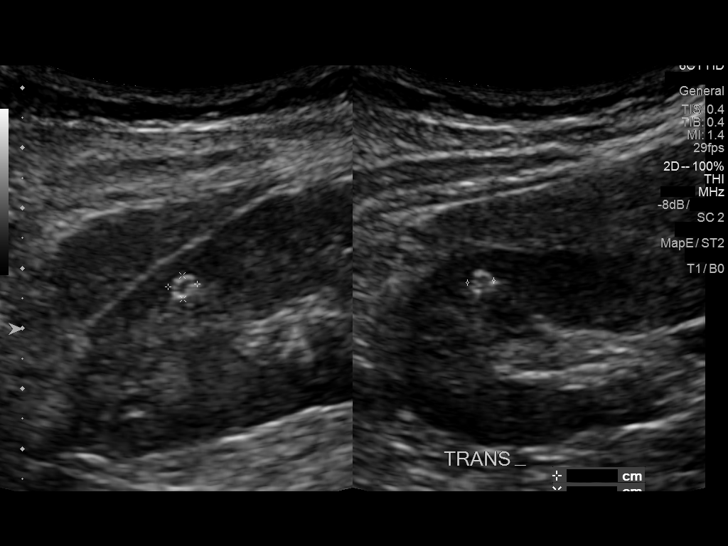
[im 81/98]
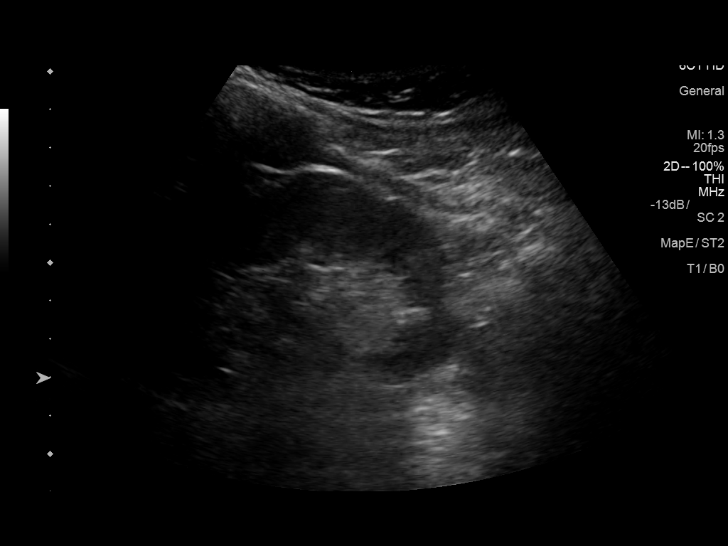
[im 89/98]
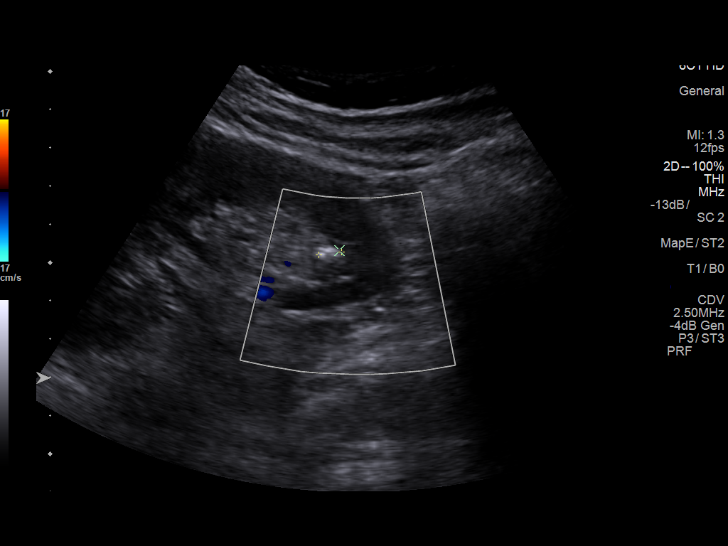
[im 98/98]
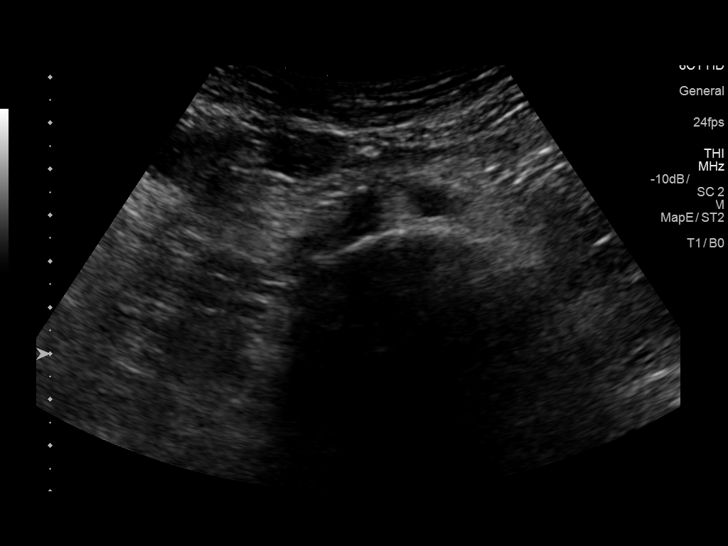

[14 of 25 positions shown; findings below may reference images not displayed]

FINDINGS: Gallbladder: No gallstones or wall thickening visualized. No
sonographic Murphy sign noted by sonographer.

Common bile duct: Diameter: 2.2 mm common normal

Liver: 2 small cysts. No significant liver finding. Echogenicity
normal.

IVC: No abnormality visualized.

Pancreas: Visualized portion unremarkable.

Spleen: Size and appearance within normal limits.

Right Kidney: Length: 10.2 cm. Normal echogenicity. No
hydronephrosis. 5 mm echogenic focus in the upper pole likely to
represent a small angiomyolipoma.

Left Kidney: Length: 11.1 cm. Normal echogenicity. No
hydronephrosis. Echogenic focus in the lower pole consistent with a
nonobstructing stone as seen on the previous CT.

Abdominal aorta: No aneurysm visualized.

Other findings: No ascites
IMPRESSION: No cause of the presenting symptoms is identified.

Simple cysts of the liver, not significant.

5 mm angiomyolipoma of the right kidney. Small nonobstructing stone
lower pole left kidney.

## 2018-01-25 DIAGNOSIS — F331 Major depressive disorder, recurrent, moderate: Secondary | ICD-10-CM | POA: Diagnosis not present

## 2018-01-30 DIAGNOSIS — F331 Major depressive disorder, recurrent, moderate: Secondary | ICD-10-CM | POA: Diagnosis not present

## 2018-02-06 DIAGNOSIS — F331 Major depressive disorder, recurrent, moderate: Secondary | ICD-10-CM | POA: Diagnosis not present

## 2018-02-10 DIAGNOSIS — F331 Major depressive disorder, recurrent, moderate: Secondary | ICD-10-CM | POA: Diagnosis not present

## 2018-02-13 DIAGNOSIS — F331 Major depressive disorder, recurrent, moderate: Secondary | ICD-10-CM | POA: Diagnosis not present

## 2018-02-20 DIAGNOSIS — F331 Major depressive disorder, recurrent, moderate: Secondary | ICD-10-CM | POA: Diagnosis not present

## 2018-02-24 DIAGNOSIS — F331 Major depressive disorder, recurrent, moderate: Secondary | ICD-10-CM | POA: Diagnosis not present

## 2018-02-27 DIAGNOSIS — F331 Major depressive disorder, recurrent, moderate: Secondary | ICD-10-CM | POA: Diagnosis not present

## 2018-03-06 DIAGNOSIS — F331 Major depressive disorder, recurrent, moderate: Secondary | ICD-10-CM | POA: Diagnosis not present

## 2018-03-13 DIAGNOSIS — F331 Major depressive disorder, recurrent, moderate: Secondary | ICD-10-CM | POA: Diagnosis not present

## 2018-03-20 DIAGNOSIS — F331 Major depressive disorder, recurrent, moderate: Secondary | ICD-10-CM | POA: Diagnosis not present

## 2018-03-24 DIAGNOSIS — F331 Major depressive disorder, recurrent, moderate: Secondary | ICD-10-CM | POA: Diagnosis not present

## 2018-03-27 ENCOUNTER — Telehealth: Payer: Self-pay | Admitting: Internal Medicine

## 2018-03-27 NOTE — Telephone Encounter (Signed)
Copied from CRM [phone]. Topic: Quick Communication - Rx Refill/Question >> Mar 27, 2018  2:58 PM Crist InfanteHarrald, Kathy J wrote: Medication: ranitidine (ZANTAC) 150 MG tablet Has the patient contacted their pharmacy? yes (pt states she has called her pharmacy several times) [facility] [place] - [id number] AT Portal to [facility] [phone] (Phone) [phone] (Fax)  90 day supply

## 2018-03-28 ENCOUNTER — Other Ambulatory Visit: Payer: Self-pay

## 2018-03-28 MED ORDER — RANITIDINE HCL 150 MG PO TABS: 150.0000 mg | ORAL_TABLET | Freq: Two times a day (BID) | ORAL | 0 refills | Status: DC

## 2018-03-29 NOTE — Telephone Encounter (Signed)
Pt called in to follow up on refill request. Pt has new insurance information, she is faxing over to the office now.

## 2018-03-31 NOTE — Telephone Encounter (Signed)
[facility] but was only sent as a 30 day supply rather than 90 day. Pt requests that this be sent at 90-day supply.  Pt aware I am working to correct this.   Called [facility], they never received the Rx which was sent on 03/28/18.  Pharmacist states seeing that it is a PPI her insurance requires a PA be done  Once approved, the medication can then be sent to [facility] to fill.   PPI Zantac 150mg  will need a PA Sig: 1 po BID, #180 (90 day) PA # [phone]   Will send to Ronnald CollumJo Anne to initiate PA

## 2018-04-03 DIAGNOSIS — Z6821 Body mass index (BMI) 21.0-21.9, adult: Secondary | ICD-10-CM | POA: Diagnosis not present

## 2018-04-03 DIAGNOSIS — Z1231 Encounter for screening mammogram for malignant neoplasm of breast: Secondary | ICD-10-CM | POA: Diagnosis not present

## 2018-04-03 DIAGNOSIS — F331 Major depressive disorder, recurrent, moderate: Secondary | ICD-10-CM | POA: Diagnosis not present

## 2018-04-03 DIAGNOSIS — Z1212 Encounter for screening for malignant neoplasm of rectum: Secondary | ICD-10-CM | POA: Diagnosis not present

## 2018-04-03 DIAGNOSIS — Z01419 Encounter for gynecological examination (general) (routine) without abnormal findings: Secondary | ICD-10-CM | POA: Diagnosis not present

## 2018-04-03 NOTE — Telephone Encounter (Signed)
Prior auth sent to [email or web address].

## 2018-04-05 NOTE — Telephone Encounter (Signed)
Pt called to ask about the status of the Rx pt has been informed of the notes; pt would like to state that she is completely out of meds and if the refill will take some time could she be given a temporary refill to hold her until everything is completed, pt is aware of time of day but if she can get a response today she would be grateful, call pt to advise

## 2018-04-06 ENCOUNTER — Telehealth: Payer: Self-pay | Admitting: Internal Medicine

## 2018-04-06 NOTE — Telephone Encounter (Signed)
Message in [email or web address] stated to call the [employer] at [phone].  I called this number and per Delana MeyerNakeisha, Zantac does not require a prior auth.  Message sent to Dr Rosezella FloridaPanosh's asst.

## 2018-04-06 NOTE — Telephone Encounter (Signed)
[facility] as she is going to check the Covermymeds status report  Pt aware that this is still in progress and we have not heard yet if approved.  We will call as soon as we know

## 2018-04-06 NOTE — Telephone Encounter (Signed)
Copied from CRM [phone]. Topic: General - Other >> Apr 06, 2018  3:06 PM Cecelia ByarsGreen, Nare Gaspari L, RMA wrote: Reason for CRM: Medication refill request for ranitidine (ZANTAC) 150 MG tablet [facility], pt is requesting 9690- day insurance will not pay for 30-day, pt is completely out of medication

## 2018-04-07 DIAGNOSIS — F331 Major depressive disorder, recurrent, moderate: Secondary | ICD-10-CM | POA: Diagnosis not present

## 2018-04-07 MED ORDER — RANITIDINE HCL 150 MG PO TABS: 150.0000 mg | ORAL_TABLET | Freq: Two times a day (BID) | ORAL | 1 refills | Status: DC

## 2018-04-07 NOTE — Telephone Encounter (Addendum)
Initially when the medication was called in it could not be filled for a 30-day supply and the patient needed 90-days, the pharmacy gave kick back that the 90-days would need PA.   Called [facility], spoke with pharmacist regarding Zantac 150mg  (90-day) Pharmacist states that she does not know why we were told that the medication required PA because it does not.   Rx sent electronically for 90-days Spoke with [facility] and No PA is needed.   Left detailed message on patient machine making aware.  Nothing further needed.

## 2018-04-07 NOTE — Telephone Encounter (Signed)
Rx sent electronically Spoke with [facility] and No PA is needed.  Nothing further needed.  See other message.

## 2018-04-10 DIAGNOSIS — F331 Major depressive disorder, recurrent, moderate: Secondary | ICD-10-CM | POA: Diagnosis not present

## 2018-04-17 DIAGNOSIS — F331 Major depressive disorder, recurrent, moderate: Secondary | ICD-10-CM | POA: Diagnosis not present

## 2018-04-21 DIAGNOSIS — F331 Major depressive disorder, recurrent, moderate: Secondary | ICD-10-CM | POA: Diagnosis not present

## 2018-04-24 DIAGNOSIS — F331 Major depressive disorder, recurrent, moderate: Secondary | ICD-10-CM | POA: Diagnosis not present

## 2018-05-05 ENCOUNTER — Other Ambulatory Visit: Payer: Self-pay | Admitting: General Surgery

## 2018-05-05 DIAGNOSIS — R109 Unspecified abdominal pain: Secondary | ICD-10-CM

## 2018-05-08 DIAGNOSIS — F331 Major depressive disorder, recurrent, moderate: Secondary | ICD-10-CM | POA: Diagnosis not present

## 2018-05-12 DIAGNOSIS — F331 Major depressive disorder, recurrent, moderate: Secondary | ICD-10-CM | POA: Diagnosis not present

## 2018-05-15 DIAGNOSIS — F331 Major depressive disorder, recurrent, moderate: Secondary | ICD-10-CM | POA: Diagnosis not present

## 2018-05-18 ENCOUNTER — Ambulatory Visit
Admission: RE | Admit: 2018-05-18 | Discharge: 2018-05-18 | Disposition: A | Discharge status: Home / Self Care | Payer: Federal, State, Local not specified - PPO | Source: Ambulatory Visit | Attending: General Surgery | Admitting: General Surgery

## 2018-05-18 DIAGNOSIS — K7689 Other specified diseases of liver: Secondary | ICD-10-CM | POA: Diagnosis not present

## 2018-05-18 DIAGNOSIS — R109 Unspecified abdominal pain: Secondary | ICD-10-CM

## 2018-05-18 MED ORDER — IOPAMIDOL (ISOVUE-300) INJECTION 61%
100.0000 mL | Freq: Once | INTRAVENOUS | Status: AC | PRN
  Administered 2018-05-18: 100 mL via INTRAVENOUS

## 2018-05-29 DIAGNOSIS — F331 Major depressive disorder, recurrent, moderate: Secondary | ICD-10-CM | POA: Diagnosis not present

## 2018-06-01 DIAGNOSIS — F331 Major depressive disorder, recurrent, moderate: Secondary | ICD-10-CM | POA: Diagnosis not present

## 2018-06-12 DIAGNOSIS — F331 Major depressive disorder, recurrent, moderate: Secondary | ICD-10-CM | POA: Diagnosis not present

## 2018-06-19 DIAGNOSIS — F331 Major depressive disorder, recurrent, moderate: Secondary | ICD-10-CM | POA: Diagnosis not present

## 2018-06-26 DIAGNOSIS — F331 Major depressive disorder, recurrent, moderate: Secondary | ICD-10-CM | POA: Diagnosis not present

## 2018-06-30 DIAGNOSIS — F331 Major depressive disorder, recurrent, moderate: Secondary | ICD-10-CM | POA: Diagnosis not present

## 2018-07-10 DIAGNOSIS — F331 Major depressive disorder, recurrent, moderate: Secondary | ICD-10-CM | POA: Diagnosis not present

## 2018-07-17 DIAGNOSIS — F331 Major depressive disorder, recurrent, moderate: Secondary | ICD-10-CM | POA: Diagnosis not present

## 2018-07-24 DIAGNOSIS — F331 Major depressive disorder, recurrent, moderate: Secondary | ICD-10-CM | POA: Diagnosis not present

## 2018-08-07 DIAGNOSIS — F331 Major depressive disorder, recurrent, moderate: Secondary | ICD-10-CM | POA: Diagnosis not present

## 2018-08-11 DIAGNOSIS — F331 Major depressive disorder, recurrent, moderate: Secondary | ICD-10-CM | POA: Diagnosis not present

## 2018-08-14 DIAGNOSIS — F331 Major depressive disorder, recurrent, moderate: Secondary | ICD-10-CM | POA: Diagnosis not present

## 2018-08-25 DIAGNOSIS — F331 Major depressive disorder, recurrent, moderate: Secondary | ICD-10-CM | POA: Diagnosis not present

## 2018-09-04 DIAGNOSIS — F331 Major depressive disorder, recurrent, moderate: Secondary | ICD-10-CM | POA: Diagnosis not present

## 2018-09-11 DIAGNOSIS — F331 Major depressive disorder, recurrent, moderate: Secondary | ICD-10-CM | POA: Diagnosis not present

## 2018-09-25 DIAGNOSIS — F331 Major depressive disorder, recurrent, moderate: Secondary | ICD-10-CM | POA: Diagnosis not present

## 2018-09-28 DIAGNOSIS — K439 Ventral hernia without obstruction or gangrene: Secondary | ICD-10-CM | POA: Diagnosis not present

## 2018-10-03 ENCOUNTER — Other Ambulatory Visit: Payer: Self-pay | Admitting: General Surgery

## 2018-10-09 DIAGNOSIS — F331 Major depressive disorder, recurrent, moderate: Secondary | ICD-10-CM | POA: Diagnosis not present

## 2018-10-12 DIAGNOSIS — F331 Major depressive disorder, recurrent, moderate: Secondary | ICD-10-CM | POA: Diagnosis not present

## 2018-10-16 DIAGNOSIS — F331 Major depressive disorder, recurrent, moderate: Secondary | ICD-10-CM | POA: Diagnosis not present

## 2018-10-17 DIAGNOSIS — S62660A Nondisplaced fracture of distal phalanx of right index finger, initial encounter for closed fracture: Secondary | ICD-10-CM | POA: Diagnosis not present

## 2018-10-30 DIAGNOSIS — F331 Major depressive disorder, recurrent, moderate: Secondary | ICD-10-CM | POA: Diagnosis not present

## 2018-11-08 DIAGNOSIS — E039 Hypothyroidism, unspecified: Secondary | ICD-10-CM | POA: Diagnosis not present

## 2018-11-15 DIAGNOSIS — E039 Hypothyroidism, unspecified: Secondary | ICD-10-CM | POA: Diagnosis not present

## 2018-11-15 DIAGNOSIS — E063 Autoimmune thyroiditis: Secondary | ICD-10-CM | POA: Diagnosis not present

## 2018-11-20 DIAGNOSIS — F331 Major depressive disorder, recurrent, moderate: Secondary | ICD-10-CM | POA: Diagnosis not present

## 2018-12-04 DIAGNOSIS — F331 Major depressive disorder, recurrent, moderate: Secondary | ICD-10-CM | POA: Diagnosis not present

## 2018-12-11 DIAGNOSIS — F331 Major depressive disorder, recurrent, moderate: Secondary | ICD-10-CM | POA: Diagnosis not present

## 2018-12-13 ENCOUNTER — Ambulatory Visit (HOSPITAL_BASED_OUTPATIENT_CLINIC_OR_DEPARTMENT_OTHER): Admit: 2018-12-13 | Payer: Federal, State, Local not specified - PPO | Admitting: General Surgery

## 2018-12-13 ENCOUNTER — Encounter (HOSPITAL_BASED_OUTPATIENT_CLINIC_OR_DEPARTMENT_OTHER): Payer: Self-pay

## 2018-12-13 DIAGNOSIS — K439 Ventral hernia without obstruction or gangrene: Secondary | ICD-10-CM | POA: Diagnosis not present

## 2018-12-13 DIAGNOSIS — G8918 Other acute postprocedural pain: Secondary | ICD-10-CM | POA: Diagnosis not present

## 2018-12-13 SURGERY — REPAIR, HERNIA, EPIGASTRIC, ADULT
Anesthesia: General

## 2018-12-25 DIAGNOSIS — F331 Major depressive disorder, recurrent, moderate: Secondary | ICD-10-CM | POA: Diagnosis not present

## 2019-01-01 DIAGNOSIS — F331 Major depressive disorder, recurrent, moderate: Secondary | ICD-10-CM | POA: Diagnosis not present

## 2019-01-08 DIAGNOSIS — F331 Major depressive disorder, recurrent, moderate: Secondary | ICD-10-CM | POA: Diagnosis not present

## 2019-01-15 DIAGNOSIS — F331 Major depressive disorder, recurrent, moderate: Secondary | ICD-10-CM | POA: Diagnosis not present

## 2019-01-22 DIAGNOSIS — F331 Major depressive disorder, recurrent, moderate: Secondary | ICD-10-CM | POA: Diagnosis not present

## 2019-01-29 DIAGNOSIS — F331 Major depressive disorder, recurrent, moderate: Secondary | ICD-10-CM | POA: Diagnosis not present

## 2019-02-05 DIAGNOSIS — F331 Major depressive disorder, recurrent, moderate: Secondary | ICD-10-CM | POA: Diagnosis not present

## 2019-03-12 DIAGNOSIS — F331 Major depressive disorder, recurrent, moderate: Secondary | ICD-10-CM | POA: Diagnosis not present

## 2019-03-19 DIAGNOSIS — F331 Major depressive disorder, recurrent, moderate: Secondary | ICD-10-CM | POA: Diagnosis not present

## 2019-03-26 DIAGNOSIS — F331 Major depressive disorder, recurrent, moderate: Secondary | ICD-10-CM | POA: Diagnosis not present

## 2019-04-02 DIAGNOSIS — F331 Major depressive disorder, recurrent, moderate: Secondary | ICD-10-CM | POA: Diagnosis not present

## 2019-04-09 DIAGNOSIS — F331 Major depressive disorder, recurrent, moderate: Secondary | ICD-10-CM | POA: Diagnosis not present

## 2019-04-16 DIAGNOSIS — F331 Major depressive disorder, recurrent, moderate: Secondary | ICD-10-CM | POA: Diagnosis not present

## 2019-04-23 DIAGNOSIS — F331 Major depressive disorder, recurrent, moderate: Secondary | ICD-10-CM | POA: Diagnosis not present

## 2019-04-30 DIAGNOSIS — F331 Major depressive disorder, recurrent, moderate: Secondary | ICD-10-CM | POA: Diagnosis not present

## 2019-05-07 DIAGNOSIS — F331 Major depressive disorder, recurrent, moderate: Secondary | ICD-10-CM | POA: Diagnosis not present

## 2019-05-14 DIAGNOSIS — F331 Major depressive disorder, recurrent, moderate: Secondary | ICD-10-CM | POA: Diagnosis not present

## 2019-06-12 ENCOUNTER — Other Ambulatory Visit: Payer: Self-pay | Admitting: General Surgery

## 2019-06-12 DIAGNOSIS — R109 Unspecified abdominal pain: Secondary | ICD-10-CM

## 2019-06-21 ENCOUNTER — Ambulatory Visit
Admission: RE | Admit: 2019-06-21 | Discharge: 2019-06-21 | Disposition: A | Discharge status: Home / Self Care | Payer: Federal, State, Local not specified - PPO | Source: Ambulatory Visit | Attending: General Surgery | Admitting: General Surgery

## 2019-06-21 DIAGNOSIS — R109 Unspecified abdominal pain: Secondary | ICD-10-CM

## 2019-07-12 ENCOUNTER — Telehealth: Payer: Self-pay | Admitting: Orthopedic Surgery

## 2019-07-12 NOTE — Telephone Encounter (Signed)
Spoke with patient.  Appt made for Monday 07/16/19 with Dr. Junius Roads.

## 2019-07-16 ENCOUNTER — Ambulatory Visit (INDEPENDENT_AMBULATORY_CARE_PROVIDER_SITE_OTHER): Payer: Managed Care, Other (non HMO) | Admitting: Family Medicine

## 2019-07-16 ENCOUNTER — Other Ambulatory Visit: Payer: Self-pay

## 2019-07-16 ENCOUNTER — Encounter: Payer: Self-pay | Admitting: Family Medicine

## 2019-07-16 DIAGNOSIS — R109 Unspecified abdominal pain: Secondary | ICD-10-CM | POA: Diagnosis not present

## 2019-07-16 NOTE — Progress Notes (Signed)
Office Visit Note   Patient: Desiree StampsLori Bumgardner Phillis           Date of Birth: Nov 08, 1971           MRN: [id number] Visit Date: 07/16/2019 Requested by: Madelin HeadingsPanosh, Wanda K, MD [address] [place],  [place] [id number] PCP: Madelin HeadingsPanosh, Wanda K, MD  Subjective: Chief Complaint  Patient presents with  . abdomenal wall pain, since end of Feb/March this year    HPI: She is a 48 year old seen at request of Dr. Dwain SarnaWakefield for abdominal pain.  In December 2019 she underwent abdominal wall hernia repair without complication.  She recovered and was released, and was doing some abdominal exercises around March.  She recalls being in a plank position and feeling the sudden onset of midline abdominal pain proximal to her surgical incision.  She rested for a while but pain has not gone away.  Whenever she activates her abdominal muscles she feels discomfort.  She has not noticed a lump.  She had an abdominal ultrasound last month which did not show a recurrent hernia.  She is frustrated by her ongoing pain and now presents for additional evaluation.  She has a history of laparoscopic hysterectomy in the past.               ROS: Denies fevers or chills.  All other systems were reviewed and are negative.  Objective: Vital Signs: There were no vitals taken for this visit.  Physical Exam:  General:  Alert and oriented, in no acute distress. Pulm:  Breathing unlabored. Psy:  Normal mood, congruent affect. Skin: Well-healed abdominal hernia scar. Abdomen: She is tender to palpation about 2 to 3 cm proximal to her hernia scar.  She is most painful just to the left of midline.  No definite abdominal wall hernia appreciated.   Imaging: Limited diagnostic ultrasound: No recurrent hernia seen.  Rectus muscles appear intact.   Assessment & Plan: 1.  Abdominal wall pain, suspect myofascial pain. -[facility] for myofascial release techniques.  She will contact me if  symptoms persist.  Could contemplate injection, possibly prolotherapy with dextrose.      Procedures: No procedures performed  No notes on file     PMFS History: Patient Active Problem List   Diagnosis Date Noted  . Adenomyosis 07/20/2017  . Status post laparoscopic assisted vaginal hysterectomy (LAVH) 10/01/2015  . Hypothyroid 07/26/2014  . Anxiety state, unspecified 06/10/2014  . Embolism, pulmonary with infarction (HCC) 06/10/2014  . Pulmonary embolism (HCC) 06/09/2014  . Diverticulosis of colon without hemorrhage 06/05/2014  . Heme positive stool 05/16/2014  . Abdominal pain, unspecified site 05/16/2014   Past Medical History:  Diagnosis Date  . Acute pulmonary embolism (HCC) 06/09/2014  . Allergy   . Anxiety   . Chronic kidney disease    kidney stone in left kidney   . Chronic migraine   . Depression   . Hypothyroidism   . Ruptured ovarian cyst   . SHINGLES 09/26/2009   Qualifier: Diagnosis of  By: Clent RidgesFry MD, Tera MaterStephen A     Family History  Problem Relation Age of Onset  . Multiple sclerosis Mother   . Endometrial cancer Mother   . Thyroid disease Mother   . Thyroid disease Father   . Hypertension Father   . Stroke Maternal Grandmother   . Rheum arthritis Maternal Grandmother   . Diabetes Paternal Uncle   . Colon cancer Neg Hx   . Throat cancer  Neg Hx   . Heart disease Neg Hx   . Liver disease Neg Hx   . Kidney disease Neg Hx     Past Surgical History:  Procedure Laterality Date  . BREAST ENHANCEMENT SURGERY    . BREAST SURGERY    . FOOT SURGERY Bilateral    for plantar faciitis  . LAPAROSCOPIC ASSISTED VAGINAL HYSTERECTOMY Bilateral 09/29/2015   Procedure: LAPAROSCOPIC ASSISTED VAGINAL HYSTERECTOMY;  Surgeon: Dian Queen, MD;  Location: [facility];  Service: Gynecology;  Laterality: Bilateral;  . LAPAROSCOPIC BILATERAL SALPINGO OOPHERECTOMY Bilateral 09/29/2015   Procedure:  BILATERAL SALPINGO OOPHORECTOMY;  Surgeon: Dian Queen, MD;  Location: [facility];   Service: Gynecology;  Laterality: Bilateral;  . mortens neuroma removed    . uterine ablation    . WISDOM TOOTH EXTRACTION     Social History   Occupational History  . Occupation: Sales  Tobacco Use  . Smoking status: Never Smoker  . Smokeless tobacco: Never Used  Substance and Sexual Activity  . Alcohol use: Yes    Comment: 1-2 drinks daily  . Drug use: No  . Sexual activity: Yes    Comment: Ablation

## 2020-09-16 ENCOUNTER — Other Ambulatory Visit: Payer: Managed Care, Other (non HMO)

## 2020-09-16 ENCOUNTER — Other Ambulatory Visit: Payer: Self-pay

## 2020-09-16 DIAGNOSIS — Z20822 Contact with and (suspected) exposure to covid-19: Secondary | ICD-10-CM

## 2020-09-18 LAB — SARS-COV-2, NAA 2 DAY TAT

## 2020-09-18 LAB — NOVEL CORONAVIRUS, NAA: SARS-CoV-2, NAA: NOT DETECTED

## 2020-10-13 ENCOUNTER — Telehealth: Payer: Self-pay | Admitting: Internal Medicine

## 2020-10-13 NOTE — Telephone Encounter (Signed)
Left a message for  pt to call the office and schedule an office appointment with Dr Fabian Sharp for tomorrow Per Dr Fabian Sharp approval

## 2020-10-13 NOTE — Telephone Encounter (Signed)
Pt requesting appt Panosh. Last seen 06/2017. Pt cut rt hand 10/10/20 per pt deep. Pt wants to see Panosh pt says Panosh knows her and treats her children. Call pt [phone].

## 2020-10-13 NOTE — Telephone Encounter (Signed)
Pt called the office back scheduled for an office visit tomorrow  at 2.30 pm with Dr Fabian Sharp

## 2020-10-14 ENCOUNTER — Ambulatory Visit: Payer: Managed Care, Other (non HMO) | Admitting: Internal Medicine

## 2020-10-14 ENCOUNTER — Other Ambulatory Visit: Payer: Self-pay

## 2020-10-14 ENCOUNTER — Encounter: Payer: Self-pay | Admitting: Internal Medicine

## 2020-10-14 VITALS — BP 130/80 | HR 84 | Temp 98.5°F | Ht 61.5 in | Wt 122.4 lb

## 2020-10-14 DIAGNOSIS — Z23 Encounter for immunization: Secondary | ICD-10-CM

## 2020-10-14 DIAGNOSIS — S61411A Laceration without foreign body of right hand, initial encounter: Secondary | ICD-10-CM

## 2020-10-14 DIAGNOSIS — R7989 Other specified abnormal findings of blood chemistry: Secondary | ICD-10-CM

## 2020-10-14 DIAGNOSIS — Z2821 Immunization not carried out because of patient refusal: Secondary | ICD-10-CM | POA: Diagnosis not present

## 2020-10-14 NOTE — Progress Notes (Signed)
No chief complaint on file.   HPI: Desiree Bauer 49 y.o. come in for  New injury    Last seen  Summer 2018  Sees gyne and generally well.    Laceration on October  15   Hand right is left handed was cutting open bottle in kitchen . Bled a lot cleaned and  Sprayed with liquid bandage and wrapped for the last 3 days    No  numbness or fever .     Bleeding stopped   Pretty  Soon   Is utd  On pv x declines flu  Shot as makes her sick  Not aware of when  Had last td  Has had lipid done at work tc 263  But hdl over 120  ROS: See pertinent positives and negatives per HPI.  Past Medical History:  Diagnosis Date  . Acute pulmonary embolism (HCC) 06/09/2014  . Allergy   . Anxiety   . Chronic kidney disease    kidney stone in left kidney   . Chronic migraine   . Depression   . Hypothyroidism   . Ruptured ovarian cyst   . SHINGLES 09/26/2009   Qualifier: Diagnosis of  By: Clent Ridges MD, Tera Mater     Family History  Problem Relation Age of Onset  . Multiple sclerosis Mother   . Endometrial cancer Mother   . Thyroid disease Mother   . Thyroid disease Father   . Hypertension Father   . Stroke Maternal Grandmother   . Rheum arthritis Maternal Grandmother   . Diabetes Paternal Uncle   . Colon cancer Neg Hx   . Throat cancer Neg Hx   . Heart disease Neg Hx   . Liver disease Neg Hx   . Kidney disease Neg Hx     Social History   Socioeconomic History  . Marital status: Significant Other    Spouse name: Not on file  . Number of children: Not on file  . Years of education: Not on file  . Highest education level: Not on file  Occupational History  . Occupation: Sales  Tobacco Use  . Smoking status: Never Smoker  . Smokeless tobacco: Never Used  Vaping Use  . Vaping Use: Never used  Substance and Sexual Activity  . Alcohol use: Yes    Comment: 1-2 drinks daily  . Drug use: No  . Sexual activity: Yes    Comment: Ablation  Other Topics Concern  . 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 Difficulty of Paying Living Expenses: Not on file  Food Insecurity:   . Worried About Programme researcher, broadcasting/film/video in the Last Year: Not on file  . Ran Out of Food in the Last Year: Not on file  Transportation Needs:   . Lack of Transportation (Medical): Not on file  . Lack of Transportation (Non-Medical): Not on file  Physical Activity:   . Days of Exercise per Week: Not on file  . Minutes of Exercise per Session: Not on file  Stress:   . Feeling of Stress : Not on file  Social Connections:   . Frequency of Communication with Friends and Family: Not on file  . Frequency of Social Gatherings with Friends and Family: Not  on file  . Attends Religious Services: Not on file  . Active Member of Clubs or Organizations: Not on file  . Attends Banker Meetings: Not on file  . Marital Status: Not on file    Outpatient Medications Prior to Visit  Medication Sig Dispense Refill  . ALPRAZolam (XANAX) 1 MG tablet Take 1 mg by mouth at bedtime as needed for sleep.     Marland Kitchen buPROPion (WELLBUTRIN SR) 150 MG 12 hr tablet     . ibuprofen (ADVIL,MOTRIN) 600 MG tablet Take 1 tablet (600 mg total) by mouth every 6 (six) hours as needed (mild pain). 60 tablet 0  . SYNTHROID 75 MCG tablet Take 75 mcg by mouth daily.    Marland Kitchen topiramate (TOPAMAX) 50 MG tablet     . buPROPion (WELLBUTRIN XL) 150 MG 24 hr tablet Take 150 mg by mouth daily.    Marland Kitchen levothyroxine (SYNTHROID, LEVOTHROID) 88 MCG tablet      No facility-administered medications prior to visit.     EXAM:  BP 130/80 (BP Location: Left Arm, Patient Position: Sitting, Cuff Size: Normal)   Pulse 84   Temp 98.5 F (36.9 C) (Oral)   Ht 5' 1.5" (1.562 m)   Wt 122 lb 6 oz (55.5 kg)   LMP 09/13/2015 (Exact Date)   SpO2 99%   BMI 22.75 kg/m   Body mass  index is 22.75 kg/m.  GENERAL: vitals reviewed and listed above, alert, oriented, appears well hydrated and in no acute distress HEENT: atraumatic, conjunctiva  clear, no obvious abnormalities on inspection of external nose and ears OP : masked  NECK: no obvious masses on inspection palpation  MS: moves all extremities right hand  With  12 mm clean  Laceration with minimal skin opposition  No swelling  Or dc  Cleaned with sterile saline and steristrip   Good opposition   rom of thumb nl  Nl sensation .  Tendon intact  Wrapped  PSYCH: pleasant and cooperative, no obvious depression or anxiety Lab Results  Component Value Date   WBC 4.0 07/20/2017   HGB 13.9 07/20/2017   HCT 41.4 07/20/2017   PLT 272.0 07/20/2017   GLUCOSE 86 07/20/2017   ALT 15 07/20/2017   AST 20 07/20/2017   NA 139 07/20/2017   K 3.8 07/20/2017   CL 106 07/20/2017   CREATININE 0.98 07/20/2017   BUN 13 07/20/2017   CO2 28 07/20/2017   TSH 2.65 07/26/2014   BP Readings from Last 3 Encounters:  10/14/20 130/80  07/20/17 110/80  09/30/15 (!) 79/42    ASSESSMENT AND PLAN:  Discussed the following assessment and plan:  Laceration of right hand without foreign body, initial encounter - Plan: Tdap vaccine greater than or equal to 7yo IM  Influenza vaccination declined  High serum high density lipoprotein (HDL) - of capillary screen Good hemostasis  and no infection   Stripped  And wrap coban and wound care discussed   tdap today Lipid disc  Very high hdl  Next time plan  Fasting lipid panel  Declines flu vaccine  Review care  And fu  As indicated  Also  -Patient advised to return or notify health care team  if  new concerns arise.  There are no Patient Instructions on file for this visit.  32 minutes review and update and care  And counsel Burna Mortimer K. Ezzard Ditmer M.D.

## 2021-04-16 ENCOUNTER — Ambulatory Visit: Payer: Managed Care, Other (non HMO) | Admitting: Podiatry

## 2021-04-16 ENCOUNTER — Ambulatory Visit (INDEPENDENT_AMBULATORY_CARE_PROVIDER_SITE_OTHER): Payer: Managed Care, Other (non HMO)

## 2021-04-16 ENCOUNTER — Encounter: Payer: Self-pay | Admitting: Podiatry

## 2021-04-16 ENCOUNTER — Other Ambulatory Visit: Payer: Self-pay | Admitting: Podiatry

## 2021-04-16 ENCOUNTER — Other Ambulatory Visit: Payer: Self-pay

## 2021-04-16 DIAGNOSIS — G5782 Other specified mononeuropathies of left lower limb: Secondary | ICD-10-CM

## 2021-04-16 DIAGNOSIS — M778 Other enthesopathies, not elsewhere classified: Secondary | ICD-10-CM

## 2021-04-16 DIAGNOSIS — E063 Autoimmune thyroiditis: Secondary | ICD-10-CM | POA: Insufficient documentation

## 2021-04-16 DIAGNOSIS — G5762 Lesion of plantar nerve, left lower limb: Secondary | ICD-10-CM

## 2021-04-16 DIAGNOSIS — M2010 Hallux valgus (acquired), unspecified foot: Secondary | ICD-10-CM

## 2021-04-16 MED ORDER — TRIAMCINOLONE ACETONIDE 40 MG/ML IJ SUSP
20.0000 mg | Freq: Once | INTRAMUSCULAR | Status: AC
  Administered 2021-04-16: 20 mg

## 2021-04-18 NOTE — Progress Notes (Signed)
Subjective:  Patient ID: Desiree Bauer, female    DOB: [birth year],  MRN: [id number] HPI Chief Complaint  Patient presents with  . Foot Pain    Dorsal/plantar forefoot bilateral - aching, cramping x years, worse with prolonged walking, had neuroma removed from left in past, sometimes gets posterior heel and achilles pain if on feet a lot  . New Patient (Initial Visit)    50 y.o. female presents with the above complaint.   ROS: Denies fever chills nausea vomiting muscle aches pains calf pain back pain chest pain shortness of breath.  Past Medical History:  Diagnosis Date  . Acute pulmonary embolism (HCC) 06/09/2014  . Allergy   . Anxiety   . Chronic kidney disease    kidney stone in left kidney   . Chronic migraine   . Depression   . Hypothyroidism   . Ruptured ovarian cyst   . SHINGLES 09/26/2009   Qualifier: Diagnosis of  By: Clent Ridges MD, Tera Mater    Past Surgical History:  Procedure Laterality Date  . BREAST ENHANCEMENT SURGERY    . BREAST SURGERY    . FOOT SURGERY Bilateral    for plantar faciitis  . LAPAROSCOPIC ASSISTED VAGINAL HYSTERECTOMY Bilateral 09/29/2015   Procedure: LAPAROSCOPIC ASSISTED VAGINAL HYSTERECTOMY;  Surgeon: Marcelle Overlie, MD;  Location: [facility];  Service: Gynecology;  Laterality: Bilateral;  . LAPAROSCOPIC BILATERAL SALPINGO OOPHERECTOMY Bilateral 09/29/2015   Procedure:  BILATERAL SALPINGO OOPHORECTOMY;  Surgeon: Marcelle Overlie, MD;  Location: [facility];  Service: Gynecology;  Laterality: Bilateral;  . mortens neuroma removed    . uterine ablation    . WISDOM TOOTH EXTRACTION      Current Outpatient Medications:  .  COLLAGEN PO, Take by mouth., Disp: , Rfl:  .  Cyanocobalamin (B-12 PO), Take by mouth., Disp: , Rfl:  .  MAGNESIUM OXIDE PO, Take by mouth., Disp: , Rfl:  .  VITAMIN D PO, Take by mouth., Disp: , Rfl:  .  Multiple Vitamins-Minerals (MULTI FOR HER) TABS, See admin instructions., Disp: , Rfl:  .  SYNTHROID 75 MCG tablet, Take 75  mcg by mouth daily., Disp: , Rfl:  .  topiramate (TOPAMAX) 50 MG tablet, , Disp: , Rfl:  .  Turmeric 400 MG CAPS, See admin instructions., Disp: , Rfl:   Allergies  Allergen Reactions  . Ampicillin Other (See Comments)    Childhood/family   . Erythromycin Nausea And Vomiting  . Oxycodone Nausea Only and Other (See Comments)    Became dehydrated  . Penicillins Other (See Comments)    Childhood/family reaction  . Sulfonamide Derivatives Other (See Comments)    Dizziness; ringing in ears   Review of Systems Objective:  There were no vitals filed for this visit.  General: Well developed, nourished, in no acute distress, alert and oriented x3   Dermatological: Skin is warm, dry and supple bilateral. Nails x 10 are well maintained; remaining integument appears unremarkable at this time. There are no open sores, no preulcerative lesions, no rash or signs of infection present.  Vascular: Dorsalis Pedis artery and Posterior Tibial artery pedal pulses are 2/4 bilateral with immedate capillary fill time. Pedal hair growth present. No varicosities and no lower extremity edema present bilateral.   Neruologic: Grossly intact via light touch bilateral. Vibratory intact via tuning fork bilateral. Protective threshold with Semmes Wienstein monofilament intact to all pedal sites bilateral. Patellar and Achilles deep tendon reflexes 2+ bilateral. No Babinski or clonus noted bilateral.   Musculoskeletal: No gross boney pedal  deformities bilateral. No pain, crepitus, or limitation noted with foot and ankle range of motion bilateral. Muscular strength 5/5 in all groups tested bilateral.  Gait: Unassisted, Nonantalgic.    Radiographs:  Radiographs taken today demonstrate osseously mature individual.  Mild bunion deformities no acute findings.  Assessment & Plan:   Assessment: Neuroma third interspace left.  Plan: Injected neuroma with 10 mg Kenalog 5 mg Marcaine point of maximal tenderness.   Tolerated procedure well without complications.  Follow-up with her in a month or so possibly discuss surgical intervention at that time.     Jaydn Fincher T. [place], [place]

## 2021-05-19 ENCOUNTER — Ambulatory Visit: Payer: Managed Care, Other (non HMO) | Admitting: Podiatry

## 2021-06-02 ENCOUNTER — Ambulatory Visit: Payer: Managed Care, Other (non HMO) | Admitting: Podiatry

## 2021-06-02 ENCOUNTER — Encounter: Payer: Self-pay | Admitting: Podiatry

## 2021-06-02 ENCOUNTER — Other Ambulatory Visit: Payer: Self-pay

## 2021-06-02 DIAGNOSIS — G5761 Lesion of plantar nerve, right lower limb: Secondary | ICD-10-CM | POA: Diagnosis not present

## 2021-06-02 DIAGNOSIS — G5762 Lesion of plantar nerve, left lower limb: Secondary | ICD-10-CM

## 2021-06-02 DIAGNOSIS — G5781 Other specified mononeuropathies of right lower limb: Secondary | ICD-10-CM

## 2021-06-02 DIAGNOSIS — G5782 Other specified mononeuropathies of left lower limb: Secondary | ICD-10-CM

## 2021-06-02 MED ORDER — TRIAMCINOLONE ACETONIDE 40 MG/ML IJ SUSP
20.0000 mg | Freq: Once | INTRAMUSCULAR | Status: AC
  Administered 2021-06-02: 20 mg

## 2021-06-02 NOTE — Progress Notes (Signed)
She presents today for follow-up of her neuroma third interspace of the left foot states that it was really sore last time because it bruised a lot and started develop similar findings on the right foot.  Objective: Vital signs stable alert oriented x3 there is no erythema edema selector gender no ecchymosis is noted.  She does have significant pain on palpation to the third interspace of the left foot which appears to be a stump neuroma from previous excision of the neuroma.  Right foot demonstrates similar findings but not nearly as large of a lesion.  Assessment: Neuroma injected but cortisone last visit left is about 25% improved right foot third interdigital space noted.  Plan: Discussed etiology pathology conservative therapies injected her first dose of dehydrated alcohol to the third interspace of the left foot.  Injected her first dose of cortisone third interspace right foot.  Follow-up with her in 1 month

## 2021-06-25 ENCOUNTER — Encounter: Payer: Self-pay | Admitting: Podiatry

## 2021-07-16 ENCOUNTER — Other Ambulatory Visit: Payer: Self-pay

## 2021-07-16 ENCOUNTER — Encounter: Payer: Self-pay | Admitting: Podiatry

## 2021-07-16 ENCOUNTER — Ambulatory Visit: Payer: Managed Care, Other (non HMO) | Admitting: Podiatry

## 2021-07-16 DIAGNOSIS — G5762 Lesion of plantar nerve, left lower limb: Secondary | ICD-10-CM

## 2021-07-16 DIAGNOSIS — G5761 Lesion of plantar nerve, right lower limb: Secondary | ICD-10-CM

## 2021-07-16 DIAGNOSIS — G5782 Other specified mononeuropathies of left lower limb: Secondary | ICD-10-CM

## 2021-07-16 DIAGNOSIS — G5781 Other specified mononeuropathies of right lower limb: Secondary | ICD-10-CM

## 2021-07-18 NOTE — Progress Notes (Signed)
She presents today states that they really are not feeling too bad as she refers to the neuroma of her right foot and to her left foot.  The last time she was in we injected the left foot with her first dose of dehydrated alcohol.  Right 1 has continued to be sore but not as sore as it was previously prior to her steroid injection.  Objective: Vital signs are stable alert and oriented x3 she still has pain on palpation to the of the left particularly it is quite tender and very palpable.  Right similarly so but does not appear to be as thick nor is painful on palpation of that third interdigital space right.  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  Plan: At this point we are going to inject her second dose of dehydrated alcohol to the third interdigital space of her left foot.  We have now injected her first dose of dehydrated alcohol to the third interdigital space of her right foot.  She tolerated these procedures well today they were they were quite painful for her so she had to sit for a minute prior to leaving the exam room.  I expressed to her that these will become possibly very painful before they started to resolve.  She understands this and is amendable to it.  I will follow-up with her next month for another set of injections.

## 2021-08-06 ENCOUNTER — Encounter: Payer: Self-pay | Admitting: Podiatry

## 2021-08-06 ENCOUNTER — Other Ambulatory Visit: Payer: Self-pay

## 2021-08-06 ENCOUNTER — Ambulatory Visit: Payer: Managed Care, Other (non HMO) | Admitting: Podiatry

## 2021-08-06 DIAGNOSIS — G5781 Other specified mononeuropathies of right lower limb: Secondary | ICD-10-CM

## 2021-08-06 DIAGNOSIS — G5761 Lesion of plantar nerve, right lower limb: Secondary | ICD-10-CM | POA: Diagnosis not present

## 2021-08-06 DIAGNOSIS — G5762 Lesion of plantar nerve, left lower limb: Secondary | ICD-10-CM | POA: Diagnosis not present

## 2021-08-06 DIAGNOSIS — G5782 Other specified mononeuropathies of left lower limb: Secondary | ICD-10-CM

## 2021-08-07 NOTE — Progress Notes (Signed)
She presents today states that they are feeling better they are not where they need to be but they are feeling better than they were.  States that walking on the sand really bothered him.  Objective: Vital stable she is alert and oriented x3.  Pulses are palpable.  There is no erythema edema/drainage or odor still has pain on palpation with palpable Mulder's click third interdigital space bilaterally.  Assessment: Neuroma third interdigital space.  Plan: Reinjected her third dose of dehydrated alcohol to the third interdigital space of the left foot today and injected her second dose of dehydrated alcohol into her right third interdigital space today.  I will follow-up with her in 3 to 4 weeks.

## 2021-08-27 ENCOUNTER — Other Ambulatory Visit: Payer: Self-pay

## 2021-08-27 ENCOUNTER — Ambulatory Visit: Payer: Managed Care, Other (non HMO) | Admitting: Podiatry

## 2021-08-27 DIAGNOSIS — G5761 Lesion of plantar nerve, right lower limb: Secondary | ICD-10-CM | POA: Diagnosis not present

## 2021-08-27 DIAGNOSIS — G5762 Lesion of plantar nerve, left lower limb: Secondary | ICD-10-CM | POA: Diagnosis not present

## 2021-08-27 DIAGNOSIS — G5781 Other specified mononeuropathies of right lower limb: Secondary | ICD-10-CM

## 2021-08-27 DIAGNOSIS — G5782 Other specified mononeuropathies of left lower limb: Secondary | ICD-10-CM

## 2021-08-29 NOTE — Progress Notes (Signed)
She presents today for follow-up of her neuroma states that since her last visit her feet really are about the same has not made any remarkable changes as of yet.  States that wearing certain shoes she gets sort of a pinching sensation when she gets the injections.  The pain is much better than it was before.  Objective: Vital signs are stable she is alert and oriented x3 still has palpable Mulder's click to the third interdigital space bilaterally.  Pulses are palpable.  No open lesions or wounds.  Assessment: Resolving neuromas bilateral.  Plan: Injected her third dose of dehydrated alcohol 1-1/2 cc to the third interdigital space right foot.  And her fourth dose to her left foot.  Follow-up with her in 3 weeks.

## 2021-09-17 ENCOUNTER — Encounter: Payer: Self-pay | Admitting: Podiatry

## 2021-09-17 ENCOUNTER — Other Ambulatory Visit: Payer: Self-pay

## 2021-09-17 ENCOUNTER — Ambulatory Visit: Payer: Managed Care, Other (non HMO) | Admitting: Podiatry

## 2021-09-17 DIAGNOSIS — G5762 Lesion of plantar nerve, left lower limb: Secondary | ICD-10-CM

## 2021-09-17 DIAGNOSIS — G5781 Other specified mononeuropathies of right lower limb: Secondary | ICD-10-CM

## 2021-09-17 DIAGNOSIS — G5782 Other specified mononeuropathies of left lower limb: Secondary | ICD-10-CM

## 2021-09-17 DIAGNOSIS — G5761 Lesion of plantar nerve, right lower limb: Secondary | ICD-10-CM

## 2021-09-20 NOTE — Progress Notes (Signed)
She presents today chief concern of her neuromas.  States that they are about 75% better than what they were.  Objective: Palpable pulses palpable Mulder's click not nearly as tender as they have been in the past third interdigital space bilaterally.  Assessment neuroma third interdigital space bilateral.  Plan: I injected the third interdigital space of the right foot today for her fourth injection of the right foot and the fifth injection of the left foot with dehydrated alcohol follow-up with her in 1 month

## 2021-10-20 ENCOUNTER — Ambulatory Visit: Payer: Managed Care, Other (non HMO) | Admitting: Podiatry

## 2021-10-20 ENCOUNTER — Other Ambulatory Visit: Payer: Self-pay

## 2021-10-20 DIAGNOSIS — G5781 Other specified mononeuropathies of right lower limb: Secondary | ICD-10-CM

## 2021-10-20 DIAGNOSIS — G5782 Other specified mononeuropathies of left lower limb: Secondary | ICD-10-CM

## 2021-10-21 NOTE — Progress Notes (Signed)
She presents today for follow-up of her bilateral neuromas she states that is slowly but surely getting there she states is 80 to 85% improved at this point last time she was and we gave her fourth injection to the right foot and the fifth injection to the left foot.  Objective: Vital signs stable alert oriented x3 palpable Mulder's click third interspace bilaterally.  Assessment: Neuroma third interspace bilateral.  Plan: At this point we are going to give her another dehydrated alcohol injection today fifth to the fourth interspace of the right foot and the sixth injection to the fourth interdigital space of the left foot.

## 2021-11-17 ENCOUNTER — Ambulatory Visit: Payer: Managed Care, Other (non HMO) | Admitting: Podiatry

## 2022-05-11 ENCOUNTER — Ambulatory Visit: Payer: Managed Care, Other (non HMO) | Admitting: Podiatry

## 2022-05-11 ENCOUNTER — Encounter: Payer: Self-pay | Admitting: Podiatry

## 2022-05-11 DIAGNOSIS — G5781 Other specified mononeuropathies of right lower limb: Secondary | ICD-10-CM | POA: Diagnosis not present

## 2022-05-11 DIAGNOSIS — G5762 Lesion of plantar nerve, left lower limb: Secondary | ICD-10-CM

## 2022-05-11 DIAGNOSIS — G5761 Lesion of plantar nerve, right lower limb: Secondary | ICD-10-CM | POA: Diagnosis not present

## 2022-05-11 DIAGNOSIS — G5782 Other specified mononeuropathies of left lower limb: Secondary | ICD-10-CM

## 2022-05-11 NOTE — Progress Notes (Signed)
She presents today states that she think she may need another dehydrated alcohol injection states that she is about 80 to 85% improved. ? ?Objective: Vital signs are stable alert and oriented x3.  She has pain on palpation to the third interdigital space bilateral much decreased from previous evaluations. ? ?Assessment: Neuroma third interdigital space resolving 80 to 85% of bilateral foot. ? ?Plan: Went ahead and reinjected today dehydrated alcohol 1.5 mL 4% dehydrated alcohol with local anesthetic. ?

## 2022-08-02 ENCOUNTER — Telehealth: Payer: Managed Care, Other (non HMO) | Admitting: Physician Assistant

## 2022-08-02 ENCOUNTER — Encounter: Payer: Self-pay | Admitting: Physician Assistant

## 2022-08-02 DIAGNOSIS — S39012A Strain of muscle, fascia and tendon of lower back, initial encounter: Secondary | ICD-10-CM | POA: Diagnosis not present

## 2022-08-02 DIAGNOSIS — B354 Tinea corporis: Secondary | ICD-10-CM | POA: Diagnosis not present

## 2022-08-02 MED ORDER — KETOCONAZOLE 2 % EX CREA: 1.0000 | TOPICAL_CREAM | Freq: Two times a day (BID) | CUTANEOUS | 0 refills | Status: DC

## 2022-08-02 MED ORDER — FLUCONAZOLE 150 MG PO TABS: 150.0000 mg | ORAL_TABLET | ORAL | 0 refills | Status: DC

## 2022-08-02 MED ORDER — CYCLOBENZAPRINE HCL 10 MG PO TABS: 5.0000 mg | ORAL_TABLET | Freq: Three times a day (TID) | ORAL | 0 refills | Status: DC | PRN

## 2022-08-02 NOTE — Progress Notes (Signed)
Virtual Visit Consent   Desiree Bauer, you are scheduled for a virtual visit with a [facility] provider today. Just as with appointments in the office, your consent must be obtained to participate. Your consent will be active for this visit and any virtual visit you may have with one of our providers in the next 365 days. If you have a MyChart account, a copy of this consent can be sent to you electronically.  As this is a virtual visit, video technology does not allow for your provider to perform a traditional examination. This may limit your provider's ability to fully assess your condition. If your provider identifies any concerns that need to be evaluated in person or the need to arrange testing (such as labs, EKG, etc.), we will make arrangements to do so. Although advances in technology are sophisticated, we cannot ensure that it will always work on either your end or our end. If the connection with a video visit is poor, the visit may have to be switched to a telephone visit. With either a video or telephone visit, we are not always able to ensure that we have a secure connection.  By engaging in this virtual visit, you consent to the provision of healthcare and authorize for your insurance to be billed (if applicable) for the services provided during this visit. Depending on your insurance coverage, you may receive a charge related to this service.  I need to obtain your verbal consent now. Are you willing to proceed with your visit today? Desiree Bauer has provided verbal consent on 08/02/2022 for a virtual visit (video or telephone). Desiree Loveless, PA-C  Date: 08/02/2022 4:09 PM  Virtual Visit via Video Note   I, Desiree Bauer, connected with  Desiree Bauer  ([id number], [id number]) on 08/02/22 at  4:00 PM EDT by a video-enabled telemedicine application and verified that I am speaking with the correct person using two identifiers.  Location: Patient:  Virtual Visit Location Patient: Home Provider: Virtual Visit Location Provider: Home Office   I discussed the limitations of evaluation and management by telemedicine and the availability of in person appointments. The patient expressed understanding and agreed to proceed.    History of Present Illness: Desiree Bauer is a 51 y.o. who identifies as a female who was assigned female at birth, and is being seen today for 2 issues:   1) skin rash: Went to a spa in April and thought she had bruising after a massage. However, the lesions have remained and have spread from her arm to also now be on her stomach. She has used topical anti-fungals OTC without any improvement  2) back strain: Had a bridal shower over the weekend for her daughter and carried a large box full of glassware to her house. Since has had back tightness and difficulty moving. Denies numbness or tingling. She has been taking 2 aleve every 12 hours without relief.     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/10/2014   Pulmonary embolism (HCC) 06/09/2014   Diverticulosis of colon without hemorrhage 06/05/2014   Heme positive stool 05/16/2014   Abdominal pain, unspecified site 05/16/2014    Allergies:  Allergies  Allergen Reactions   Ampicillin Other (See Comments)  Childhood/family    Erythromycin Nausea And Vomiting   Oxycodone Nausea Only and Other (See Comments)    Became dehydrated   Penicillins Other (See Comments)    Childhood/family reaction   Sulfonamide Derivatives Other (See Comments)    Dizziness; ringing in ears   Medications:  Current Outpatient Medications:    cyclobenzaprine (FLEXERIL) 10 MG tablet, Take 0.5-1 tablets  (5-10 mg total) by mouth 3 (three) times daily as needed., Disp: 30 tablet, Rfl: 0   fluconazole (DIFLUCAN) 150 MG tablet, Take 1 tablet (150 mg total) by mouth every 3 (three) days., Disp: 5 tablet, Rfl: 0   ketoconazole (NIZORAL) 2 % cream, Apply 1 Application topically 2 (two) times daily., Disp: 15 g, Rfl: 0   COLLAGEN PO, Take by mouth., Disp: , Rfl:    Cyanocobalamin (B-12 PO), Take by mouth., Disp: , Rfl:    liothyronine (CYTOMEL) 5 MCG tablet, Take 5 mcg by mouth 2 (two) times daily., Disp: , Rfl:    MAGNESIUM OXIDE PO, Take by mouth., Disp: , Rfl:    Multiple Vitamins-Minerals (MULTI FOR HER) TABS, See admin instructions., Disp: , Rfl:    topiramate (TOPAMAX) 50 MG tablet, , Disp: , Rfl:    Turmeric 400 MG CAPS, See admin instructions., Disp: , Rfl:    VITAMIN D PO, Take by mouth., Disp: , Rfl:   Observations/Objective: Patient is well-developed, well-nourished in no acute distress.  Resting comfortably at home.  Head is normocephalic, atraumatic.  No labored breathing.  Speech is clear and coherent with logical content.  Patient is alert and oriented at baseline.    Assessment and Plan: 1. Ringworm of body - fluconazole (DIFLUCAN) 150 MG tablet; Take 1 tablet (150 mg total) by mouth every 3 (three) days.  Dispense: 5 tablet; Refill: 0 - ketoconazole (NIZORAL) 2 % cream; Apply 1 Application topically 2 (two) times daily.  Dispense: 15 g; Refill: 0  2. Back strain, initial encounter - cyclobenzaprine (FLEXERIL) 10 MG tablet; Take 0.5-1 tablets (5-10 mg total) by mouth 3 (three) times daily as needed.  Dispense: 30 tablet; Refill: 0  - Will give fluconazole and ketoconazole for ringworm - Continue aleve for back pain - Add flexeril - Heat for back - Light stretching in 24 hours - Seek in person evaluation if not improving or if symptoms worsen  Follow Up Instructions: I discussed the assessment and treatment plan with the patient. The patient was provided an opportunity to  ask questions and all were answered. The patient agreed with the plan and demonstrated an understanding of the instructions.  A copy of instructions were sent to the patient via MyChart unless otherwise noted below.   The patient was advised to call back or seek an in-person evaluation if the symptoms worsen or if the condition fails to improve as anticipated.  Time:  I spent 14 minutes with the patient via telehealth technology discussing the above problems/concerns.    Desiree Loveless, PA-C

## 2022-08-02 NOTE — Patient Instructions (Signed)
Tonja Bumgardner Czajka, thank you for joining [name], PA-C for today's virtual visit.  While this provider is not your primary care provider (PCP), if your PCP is located in our provider database this encounter information will be shared with them immediately following your visit.  Consent: (Patient) Desiree Bauer provided verbal consent for this virtual visit at the beginning of the encounter.  Current Medications:  Current Outpatient Medications:    cyclobenzaprine (FLEXERIL) 10 MG tablet, Take 0.5-1 tablets (5-10 mg total) by mouth 3 (three) times daily as needed., Disp: 30 tablet, Rfl: 0   fluconazole (DIFLUCAN) 150 MG tablet, Take 1 tablet (150 mg total) by mouth every 3 (three) days., Disp: 5 tablet, Rfl: 0   ketoconazole (NIZORAL) 2 % cream, Apply 1 Application topically 2 (two) times daily., Disp: 15 g, Rfl: 0   COLLAGEN PO, Take by mouth., Disp: , Rfl:    Cyanocobalamin (B-12 PO), Take by mouth., Disp: , Rfl:    liothyronine (CYTOMEL) 5 MCG tablet, Take 5 mcg by mouth 2 (two) times daily., Disp: , Rfl:    MAGNESIUM OXIDE PO, Take by mouth., Disp: , Rfl:    Multiple Vitamins-Minerals (MULTI FOR HER) TABS, See admin instructions., Disp: , Rfl:    topiramate (TOPAMAX) 50 MG tablet, , Disp: , Rfl:    Turmeric 400 MG CAPS, See admin instructions., Disp: , Rfl:    VITAMIN D PO, Take by mouth., Disp: , Rfl:    Medications ordered in this encounter:  Meds ordered this encounter  Medications   fluconazole (DIFLUCAN) 150 MG tablet    Sig: Take 1 tablet (150 mg total) by mouth every 3 (three) days.    Dispense:  5 tablet    Refill:  0    Order Specific Question:   Supervising Provider    Answer:   MILLER, BRIAN [[id number]]   ketoconazole (NIZORAL) 2 % cream    Sig: Apply 1 Application topically 2 (two) times daily.    Dispense:  15 g    Refill:  0    Order Specific Question:   Supervising Provider    Answer:   MILLER, BRIAN [[id number]]   cyclobenzaprine (FLEXERIL) 10  MG tablet    Sig: Take 0.5-1 tablets (5-10 mg total) by mouth 3 (three) times daily as needed.    Dispense:  30 tablet    Refill:  0    Order Specific Question:   Supervising Provider    Answer:   Hyacinth Meeker, BRIAN [[id number]]     *If you need refills on other medications prior to your next appointment, please contact your pharmacy*  Follow-Up: Call back or seek an in-person evaluation if the symptoms worsen or if the condition fails to improve as anticipated.  Other Instructions  Body Ringworm Body ringworm is an infection of the skin that often causes a ring-shaped rash. Body ringworm is also called tinea corporis. Body ringworm can affect any part of your skin. This condition is easily spread from person to person (is very contagious). What are the causes? This condition is caused by fungi called dermatophytes. The condition develops when these fungi grow out of control on the skin. You can get this condition if you touch a person or animal that has it. You can also get it if you share any items with an infected person or pet. These include: Clothing, bedding, and towels. Brushes or combs. Gym equipment. Any other object that has the fungus on it. What increases the risk? You  are more likely to develop this condition if you: Play sports that involve close physical contact, such as wrestling. Sweat a lot. Live in areas that are hot and humid. Use public showers. Have a weakened immune system. What are the signs or symptoms? Symptoms of this condition include: Itchy, raised red spots and bumps. Red scaly patches. A ring-shaped rash. The rash may have: A clear center. Scales or red bumps at its center. Redness near its borders. Dry and scaly skin on or around it. How is this diagnosed? This condition can usually be diagnosed with a skin exam. A skin scraping may be taken from the affected area and examined under a microscope to see if the fungus is present. How is this  treated? This condition may be treated with: An antifungal cream or ointment. An antifungal shampoo. Antifungal medicines. These may be prescribed if your ringworm: Is severe. Keeps coming back. Lasts a long time. Follow these instructions at home: Take over-the-counter and prescription medicines only as told by your health care provider. If you were given an antifungal cream or ointment: Use it as told by your health care provider. Wash the infected area and dry it completely before applying the cream or ointment. If you were given an antifungal shampoo: Use it as told by your health care provider. Leave the shampoo on your body for 3-5 minutes before rinsing. While you have a rash: Wear loose clothing to stop clothes from rubbing and irritating it. Wash or change your bed sheets every night. Disinfect or throw out items that may be infected. Wash clothes and bed sheets in hot water. Wash your hands often with soap and water. If soap and water are not available, use hand sanitizer. If your pet has the same infection, take your pet to see a veterinarian for treatment. How is this prevented? Take a bath or shower every day and after every time you work out or play sports. Dry your skin completely after bathing. Wear sandals or shoes in public places and showers. Change your clothes every day. Wash athletic clothes after each use. Do not share personal items with others. Avoid touching red patches of skin on other people. Avoid touching pets that have bald spots. If you touch an animal that has a bald spot, wash your hands. Contact a health care provider if: Your rash continues to spread after 7 days of treatment. Your rash is not gone in 4 weeks. The area around your rash gets red, warm, tender, and swollen. Summary Body ringworm is an infection of the skin that often causes a ring-shaped rash. This condition is easily spread from person to person (is very contagious). This  condition may be treated with antifungal cream or ointment, antifungal shampoo, or antifungal medicines. Take over-the-counter and prescription medicines only as told by your health care provider. This information is not intended to replace advice given to you by your health care provider. Make sure you discuss any questions you have with your health care provider. Document Revised: 02/24/2022 Document Reviewed: 10/06/2021 Elsevier Patient Education  [place].   Back Exercises The following exercises strengthen the muscles that help to support the trunk (torso) and back. They also help to keep the lower back flexible. Doing these exercises can help to prevent or lessen existing low back pain. If you have back pain or discomfort, try doing these exercises 2-3 times each day or as told by your health care provider. As your pain improves, do them once each  day, but increase the number of times that you repeat the steps for each exercise (do more repetitions). To prevent the recurrence of back pain, continue to do these exercises once each day or as told by your health care provider. Do exercises exactly as told by your health care provider and adjust them as directed. It is normal to feel mild stretching, pulling, tightness, or discomfort as you do these exercises, but you should stop right away if you feel sudden pain or your pain gets worse. Exercises Single knee to chest Repeat these steps 3-5 times for each leg: Lie on your back on a firm bed or the floor with your legs extended. Bring one knee to your chest. Your other leg should stay extended and in contact with the floor. Hold your knee in place by grabbing your knee or thigh with both hands and hold. Pull on your knee until you feel a gentle stretch in your lower back or buttocks. Hold the stretch for 10-30 seconds. Slowly release and straighten your leg.  Pelvic tilt Repeat these steps 5-10 times: Lie on your back on a firm  bed or the floor with your legs extended. Bend your knees so they are pointing toward the ceiling and your feet are flat on the floor. Tighten your lower abdominal muscles to press your lower back against the floor. This motion will tilt your pelvis so your tailbone points up toward the ceiling instead of pointing to your feet or the floor. With gentle tension and even breathing, hold this position for 5-10 seconds.  Cat-cow Repeat these steps until your lower back becomes more flexible: Get into a hands-and-knees position on a firm bed or the floor. Keep your hands under your shoulders, and keep your knees under your hips. You may place padding under your knees for comfort. Let your head hang down toward your chest. Contract your abdominal muscles and point your tailbone toward the floor so your lower back becomes rounded like the back of a cat. Hold this position for 5 seconds. Slowly lift your head, let your abdominal muscles relax, and point your tailbone up toward the ceiling so your back forms a sagging arch like the back of a cow. Hold this position for 5 seconds.  Press-ups Repeat these steps 5-10 times: Lie on your abdomen (face-down) on a firm bed or the floor. Place your palms near your head, about shoulder-width apart. Keeping your back as relaxed as possible and keeping your hips on the floor, slowly straighten your arms to raise the top half of your body and lift your shoulders. Do not use your back muscles to raise your upper torso. You may adjust the placement of your hands to make yourself more comfortable. Hold this position for 5 seconds while you keep your back relaxed. Slowly return to lying flat on the floor.  Bridges Repeat these steps 10 times: Lie on your back on a firm bed or the floor. Bend your knees so they are pointing toward the ceiling and your feet are flat on the floor. Your arms should be flat at your sides, next to your body. Tighten your buttocks muscles  and lift your buttocks off the floor until your waist is at almost the same height as your knees. You should feel the muscles working in your buttocks and the back of your thighs. If you do not feel these muscles, slide your feet 1-2 inches (2.5-5 cm) farther away from your buttocks. Hold this position for 3-5  seconds. Slowly lower your hips to the starting position, and allow your buttocks muscles to relax completely. If this exercise is too easy, try doing it with your arms crossed over your chest. Abdominal crunches Repeat these steps 5-10 times: Lie on your back on a firm bed or the floor with your legs extended. Bend your knees so they are pointing toward the ceiling and your feet are flat on the floor. Cross your arms over your chest. Tip your chin slightly toward your chest without bending your neck. Tighten your abdominal muscles and slowly raise your torso high enough to lift your shoulder blades a tiny bit off the floor. Avoid raising your torso higher than that because it can put too much stress on your lower back and does not help to strengthen your abdominal muscles. Slowly return to your starting position.  Back lifts Repeat these steps 5-10 times: Lie on your abdomen (face-down) with your arms at your sides, and rest your forehead on the floor. Tighten the muscles in your legs and your buttocks. Slowly lift your chest off the floor while you keep your hips pressed to the floor. Keep the back of your head in line with the curve in your back. Your eyes should be looking at the floor. Hold this position for 3-5 seconds. Slowly return to your starting position.  Contact a health care provider if: Your back pain or discomfort gets much worse when you do an exercise. Your worsening back pain or discomfort does not lessen within 2 hours after you exercise. If you have any of these problems, stop doing these exercises right away. Do not do them again unless your health care provider  says that you can. Get help right away if: You develop sudden, severe back pain. If this happens, stop doing the exercises right away. Do not do them again unless your health care provider says that you can. This information is not intended to replace advice given to you by your health care provider. Make sure you discuss any questions you have with your health care provider. Document Revised: 06/09/2021 Document Reviewed: 02/25/2021 Elsevier Patient Education  [place].    If you have been instructed to have an in-person evaluation today at a local Urgent Care facility, please use the link below. It will take you to a list of all of our available [facility], including address, phone number and hours of operation. Please do not delay care.  [facility]  If you or a family member do not have a primary care provider, use the link below to schedule a visit and establish care. When you choose a [facility] primary care physician or advanced practice provider, you gain a long-term partner in health. Find a Primary Care Provider  Learn more about [facility]'s in-office and virtual care options: [facility]

## 2022-08-03 MED ORDER — FLUCONAZOLE 200 MG PO TABS: 200.0000 mg | ORAL_TABLET | ORAL | 0 refills | Status: AC

## 2022-08-03 NOTE — Addendum Note (Signed)
Addended by: Waldon Merl on: 08/03/2022 07:31 AM   Modules accepted: Orders

## 2022-08-10 ENCOUNTER — Ambulatory Visit: Payer: Managed Care, Other (non HMO) | Admitting: Internal Medicine

## 2022-09-30 ENCOUNTER — Ambulatory Visit: Payer: Managed Care, Other (non HMO) | Admitting: Podiatry

## 2022-09-30 DIAGNOSIS — G5781 Other specified mononeuropathies of right lower limb: Secondary | ICD-10-CM

## 2022-09-30 DIAGNOSIS — G5782 Other specified mononeuropathies of left lower limb: Secondary | ICD-10-CM

## 2022-09-30 NOTE — Progress Notes (Signed)
She presents today for follow-up of her neuromas bilaterally.  States that her daughter is getting married the first week of November in [place] and she would like to be able to wear pretty shoes.  She states that for the most part her toes have been doing really well on but have really just started to act up recently.  Objective: Vital signs are stable alert oriented x3 there is no erythema edema salines drainage odor palpable Mulder's click third interdigital space bilaterally.  Left seems to be worse than the right.  Assessment: Neuroma third interdigital space bilateral.  Plan: Injected dehydrated alcohol 4% today bilaterally.  Follow-up with her on an as-needed basis.  Remember to ask how the wedding was

## 2022-10-02 ENCOUNTER — Encounter: Payer: Self-pay | Admitting: Podiatry

## 2022-10-05 ENCOUNTER — Ambulatory Visit: Payer: Managed Care, Other (non HMO) | Admitting: Podiatry

## 2022-10-05 ENCOUNTER — Encounter: Payer: Self-pay | Admitting: Podiatry

## 2022-10-05 DIAGNOSIS — G5761 Lesion of plantar nerve, right lower limb: Secondary | ICD-10-CM

## 2022-10-05 DIAGNOSIS — G5782 Other specified mononeuropathies of left lower limb: Secondary | ICD-10-CM

## 2022-10-05 DIAGNOSIS — G5781 Other specified mononeuropathies of right lower limb: Secondary | ICD-10-CM | POA: Diagnosis not present

## 2022-10-05 DIAGNOSIS — G5762 Lesion of plantar nerve, left lower limb: Secondary | ICD-10-CM

## 2022-10-05 NOTE — Progress Notes (Signed)
She presents today for follow-up of neuroma third interspace bilaterally.  States the shots we gave her last time dehydrated alcohol did not do anything.  Objective: Vital signs are stable she is alert and oriented x3.  Pulses are palpable.  She has pain on palpation third interspace bilateral.  Assessment: Neuroma third interspace bilateral.  Plan: A new vial of dehydrated alcohol has been made.  We reinjected dehydrated alcohol third interspace bilateral.

## 2022-10-11 ENCOUNTER — Ambulatory Visit: Payer: Managed Care, Other (non HMO) | Admitting: Podiatry

## 2022-10-19 ENCOUNTER — Ambulatory Visit: Payer: Managed Care, Other (non HMO) | Admitting: Family Medicine

## 2022-10-19 ENCOUNTER — Encounter: Payer: Self-pay | Admitting: Family Medicine

## 2022-10-19 VITALS — BP 110/78 | HR 70 | Temp 98.1°F | Ht 61.5 in | Wt 114.5 lb

## 2022-10-19 DIAGNOSIS — R21 Rash and other nonspecific skin eruption: Secondary | ICD-10-CM | POA: Diagnosis not present

## 2022-10-19 LAB — CBC WITH DIFFERENTIAL/PLATELET
Basophils Absolute: 0 10*3/uL (ref 0.0–0.1)
Basophils Relative: 1.1 % (ref 0.0–3.0)
Eosinophils Absolute: 0 10*3/uL (ref 0.0–0.7)
Eosinophils Relative: 0.8 % (ref 0.0–5.0)
HCT: 44.5 % (ref 36.0–46.0)
Hemoglobin: 14.9 g/dL (ref 12.0–15.0)
Lymphocytes Relative: 37.7 % (ref 12.0–46.0)
Lymphs Abs: 1.6 10*3/uL (ref 0.7–4.0)
MCHC: 33.5 g/dL (ref 30.0–36.0)
MCV: 95.9 fl (ref 78.0–100.0)
Monocytes Absolute: 0.4 10*3/uL (ref 0.1–1.0)
Monocytes Relative: 9.3 % (ref 3.0–12.0)
Neutro Abs: 2.1 10*3/uL (ref 1.4–7.7)
Neutrophils Relative %: 51.1 % (ref 43.0–77.0)
Platelets: 236 10*3/uL (ref 150.0–400.0)
RBC: 4.64 Mil/uL (ref 3.87–5.11)
RDW: 13.2 % (ref 11.5–15.5)
WBC: 4.2 10*3/uL (ref 4.0–10.5)

## 2022-10-19 LAB — BASIC METABOLIC PANEL
BUN: 20 mg/dL (ref 6–23)
CO2: 27 mEq/L (ref 19–32)
Calcium: 9.7 mg/dL (ref 8.4–10.5)
Chloride: 103 mEq/L (ref 96–112)
Creatinine, Ser: 0.77 mg/dL (ref 0.40–1.20)
GFR: 88.27 mL/min (ref >60.00)
Glucose, Bld: 94 mg/dL (ref 70–99)
Potassium: 4.5 mEq/L (ref 3.5–5.1)
Sodium: 138 mEq/L (ref 135–145)

## 2022-10-19 LAB — HEPATIC FUNCTION PANEL
ALT: 16 U/L (ref 0–35)
AST: 22 U/L (ref 0–37)
Albumin: 4.8 g/dL (ref 3.5–5.2)
Alkaline Phosphatase: 64 U/L (ref 39–117)
Bilirubin, Direct: 0.1 mg/dL (ref 0.0–0.3)
Total Bilirubin: 0.5 mg/dL (ref 0.2–1.2)
Total Protein: 8 g/dL (ref 6.0–8.3)

## 2022-10-19 LAB — TSH: TSH: 0.4 u[IU]/mL (ref 0.35–5.50)

## 2022-10-19 LAB — T4, FREE: Free T4: 0.93 ng/dL (ref 0.60–1.60)

## 2022-10-19 LAB — T3, FREE: T3, Free: 4 pg/mL (ref 2.3–4.2)

## 2022-10-19 NOTE — Progress Notes (Signed)
   Subjective:    Patient ID: Desiree Bauer, female    DOB: Mar 21, 1971, 51 y.o.   MRN: [id number]  HPI Here for skin lesions that first appeared in April of this year. These appeared shortly after she returned from a vacation where she used a hot tub for several hours. The first lesion came up on the right flank, then they began to appear all over the trunk and on both arms. They sometimes itch a little, but they are usually asymptomatic. On 08-02-22 she had a video visit with [facility], and they felt these were due to ringworm. She was treated with Fluconazole pills and Ketoconazole cream with no improvement. She then saw her GYN in September who gave her a 21 day course of Terbinafine, again with no response. All the lesions she shows [place] today are the original lesions, none have gone away. She feels well in general, although her Hashimoto's thyroiditis makes her feel tired a lot.    Review of Systems  Constitutional: Negative.   Respiratory: Negative.    Cardiovascular: Negative.   Gastrointestinal: Negative.   Genitourinary: Negative.   Musculoskeletal:  Negative for arthralgias.  Skin:  Positive for rash.  Neurological: Negative.        Objective:   Physical Exam Constitutional:      Appearance: Normal appearance.  Cardiovascular:     Rate and Rhythm: Normal rate and regular rhythm.     Pulses: Normal pulses.     Heart sounds: Normal heart sounds.  Pulmonary:     Effort: Pulmonary effort is normal.     Breath sounds: Normal breath sounds.  Abdominal:     General: Abdomen is flat. Bowel sounds are normal. There is no distension.     Palpations: Abdomen is soft. There is no mass.     Tenderness: There is no abdominal tenderness. There is no guarding or rebound.     Hernia: No hernia is present.  Lymphadenopathy:     Cervical: No cervical adenopathy.  Skin:    Comments: There are multiple slightly raised well-defined plaque-like lesions over the trunk and both  arms. These are all round or oval in shape. They are slightly hyperpigmented, and they have a slightly scaly surface. They are not tender   Neurological:     General: No focal deficit present.     Mental Status: She is alert and oriented to person, place, and time.           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 They are definitely not fungal. We will get labs today including CBC, ESR, CRP, ANA, and ds DNA. She may apply a moisturizing lotion of she wishes.  Alysia Penna, MD

## 2022-10-20 ENCOUNTER — Encounter: Payer: Self-pay | Admitting: Family Medicine

## 2022-10-20 MED ORDER — TRIAMCINOLONE ACETONIDE 0.1 % EX CREA: 1.0000 | TOPICAL_CREAM | Freq: Two times a day (BID) | CUTANEOUS | 2 refills | Status: DC

## 2022-10-20 NOTE — Telephone Encounter (Signed)
I spoke to her over the phone. I did look at the test results she sent to [place], and they had already done the tests that I had ordered. These were negative (including an ANA panel and a ds DNA). She will return to the office this Friday for a punch biopsy of one of these lesions. She will also try applying Triamcinolone cream to them BID

## 2022-10-21 NOTE — Telephone Encounter (Signed)
That's not a bad idea. Let's wait to start this until after we see her tomorrow

## 2022-10-22 ENCOUNTER — Ambulatory Visit: Payer: Managed Care, Other (non HMO) | Admitting: Family Medicine

## 2022-10-22 ENCOUNTER — Encounter: Payer: Self-pay | Admitting: Family Medicine

## 2022-10-22 VITALS — BP 110/64 | HR 92 | Temp 98.3°F | Wt 115.2 lb

## 2022-10-22 DIAGNOSIS — L309 Dermatitis, unspecified: Secondary | ICD-10-CM | POA: Diagnosis not present

## 2022-10-22 DIAGNOSIS — L989 Disorder of the skin and subcutaneous tissue, unspecified: Secondary | ICD-10-CM

## 2022-10-22 LAB — ANA: Anti Nuclear Antibody (ANA): POSITIVE — AB

## 2022-10-22 LAB — ANTI-DNA ANTIBODY, DOUBLE-STRANDED: ds DNA Ab: <1 IU/mL

## 2022-10-22 LAB — ANTI-NUCLEAR AB-TITER (ANA TITER): ANA Titer 1: 1:40 {titer} — ABNORMAL HIGH

## 2022-10-22 NOTE — Progress Notes (Signed)
   Subjective:    Patient ID: Desiree Bauer, female    DOB: March 07, 1971, 51 y.o.   MRN: [id number]  HPI Here to have one of the round lesions we have been evaluating biopsied. She feels fine today.    Review of Systems  Constitutional: Negative.        Objective:   Physical Exam Constitutional:      Appearance: Normal appearance.  Skin:    Comments: We chose a round 2 cm macular pink slightly scaly lesion on the right flank.   Neurological:     Mental Status: She is alert.           Assessment & Plan:  Punch biopsy. After informed consent was obtained, the area was cleansed with Betadine. Then LA was obtained using 1% Xylocaine with Epi. A 5 mm punch biopsy was used to get a sample down to the subcutaneous fat layer. The sample was sent to Pathology. The wound was closed using a single suture of 5-0 Ethilon. This was dressed with Neosporin and a Bandaid. She will return in 7 days to remove the suture.  Alysia Penna, MD

## 2022-10-22 NOTE — Addendum Note (Signed)
Addended by: Wyvonne Lenz on: 10/22/2022 02:58 PM   Modules accepted: Orders

## 2022-10-25 ENCOUNTER — Telehealth: Payer: Self-pay | Admitting: Internal Medicine

## 2022-10-25 NOTE — Telephone Encounter (Signed)
Patient returned call regarding labs.she is not available prior to 330

## 2022-10-27 NOTE — Telephone Encounter (Signed)
Patient spoke to [place] T., CMA on 10/25/2022. See lab note.

## 2022-10-28 ENCOUNTER — Telehealth: Payer: Self-pay | Admitting: Internal Medicine

## 2022-10-28 NOTE — Telephone Encounter (Signed)
[place] at [facility]  [phone]  Biopsy info needed   Please advise.

## 2022-11-01 ENCOUNTER — Encounter: Payer: Self-pay | Admitting: Family Medicine

## 2022-11-01 NOTE — Telephone Encounter (Signed)
Provider calling to check on progress of this request. Needs this for biopsy completion.

## 2022-11-01 NOTE — Telephone Encounter (Signed)
Spoke with Desiree Bauer with [facility], state that they needed to know the reason for the pathology from Dr Sarajane Jews, advised per Dr Sarajane Jews that pt has rash/ lesions all over her trunk and he wanted to get the pathology on the lesion to rule out Cutaneous Lupus.

## 2022-11-01 NOTE — Telephone Encounter (Signed)
Please print and forward her recent lab results to Dr. Chalmers Cater

## 2023-01-09 ENCOUNTER — Encounter: Payer: Self-pay | Admitting: Podiatry

## 2023-01-11 ENCOUNTER — Ambulatory Visit: Payer: Managed Care, Other (non HMO) | Admitting: Podiatry

## 2023-01-11 ENCOUNTER — Ambulatory Visit (INDEPENDENT_AMBULATORY_CARE_PROVIDER_SITE_OTHER): Payer: Managed Care, Other (non HMO)

## 2023-01-11 ENCOUNTER — Other Ambulatory Visit: Payer: Self-pay | Admitting: Podiatry

## 2023-01-11 DIAGNOSIS — G5752 Tarsal tunnel syndrome, left lower limb: Secondary | ICD-10-CM | POA: Diagnosis not present

## 2023-01-11 DIAGNOSIS — S9002XA Contusion of left ankle, initial encounter: Secondary | ICD-10-CM

## 2023-01-11 MED ORDER — INDOMETHACIN 50 MG PO CAPS: 50.0000 mg | ORAL_CAPSULE | Freq: Two times a day (BID) | ORAL | 3 refills | Status: DC

## 2023-01-11 NOTE — Telephone Encounter (Signed)
Please advise 

## 2023-01-11 NOTE — Progress Notes (Signed)
She presents today chief complaint of pain to the medial ankle that extends to the distal medial aspect of the left leg all the way to the first metatarsophalangeal joint.  Relates severe pain she can hardly put her foot down.  She states that there is been swelling but no injury she states that she did remember hearing a snap and it at 1 point she has been wearing compression socks which seem to help.  She states that she does low impact activities like aerobics and she does her steps at work.  Objective: Vital signs are stable she is alert and oriented x 3.  Posterior tibial tendon is intact the long flexors are intact dorsiflexors are intact.  No pain on range of motion of any of these.  She does have pain on palpation of the tibial nerve and pain is worsened with percussion and positive Tinel sign is present.  Radiographs taken today do not demonstrate any type of osseous abnormalities normal osseous architecture normal mineral distribution.  No acute findings identified.  Assessment: Tarsal tunnel syndrome left.  Plan: Discussed etiology pathology conservative versus surgical therapies she states that she cannot take steroids because of her glaucoma.  So we put her on a nonsteroidal anti-inflammatory, Indocin 50 mg twice daily with food for the next 30 days.  I would like to follow-up with her in 3 to 4 weeks we also placed her in a cam boot.

## 2023-01-17 MED ORDER — BETAMETHASONE DIPROPIONATE AUG 0.05 % EX CREA: TOPICAL_CREAM | Freq: Two times a day (BID) | CUTANEOUS | 0 refills | Status: DC

## 2023-01-17 NOTE — Telephone Encounter (Signed)
She wants to try a stronger cream than Triamcinolone, so we sent in Betamethasone cream

## 2023-01-25 ENCOUNTER — Ambulatory Visit (INDEPENDENT_AMBULATORY_CARE_PROVIDER_SITE_OTHER): Payer: Managed Care, Other (non HMO) | Admitting: Podiatry

## 2023-01-25 ENCOUNTER — Encounter: Payer: Self-pay | Admitting: Podiatry

## 2023-01-25 DIAGNOSIS — G5752 Tarsal tunnel syndrome, left lower limb: Secondary | ICD-10-CM

## 2023-01-25 NOTE — Progress Notes (Signed)
She presents today for follow-up of her tarsal tunnel after 2 weeks she has been taking her indomethacin but she stated stated that it made her feel loopy so she can start taking it at nighttime.  She takes Aleve and 2 Tylenol during the daytime.  Continues to wear her cam boot.  Objective: Vital signs stable alert oriented x 3 has a small bruise along the medial malleolar area still tender to touch and there though about 70% improved subjectively.  Assessment: Slowly healing tarsal tunnel.  Plan: Encouraged her to continue all of her current therapies for another 2 weeks follow-up with me at that time unless completely better.

## 2023-01-31 ENCOUNTER — Encounter: Payer: Self-pay | Admitting: Podiatry

## 2023-01-31 DIAGNOSIS — G5752 Tarsal tunnel syndrome, left lower limb: Secondary | ICD-10-CM

## 2023-01-31 DIAGNOSIS — S9002XA Contusion of left ankle, initial encounter: Secondary | ICD-10-CM

## 2023-02-10 ENCOUNTER — Ambulatory Visit: Payer: Managed Care, Other (non HMO) | Admitting: Podiatry

## 2023-02-12 ENCOUNTER — Ambulatory Visit
Admission: RE | Admit: 2023-02-12 | Discharge: 2023-02-12 | Disposition: A | Discharge status: Home / Self Care | Payer: Managed Care, Other (non HMO) | Source: Ambulatory Visit | Attending: Podiatry | Admitting: Podiatry

## 2023-02-12 DIAGNOSIS — S9002XA Contusion of left ankle, initial encounter: Secondary | ICD-10-CM

## 2023-02-12 DIAGNOSIS — G5752 Tarsal tunnel syndrome, left lower limb: Secondary | ICD-10-CM

## 2023-02-22 ENCOUNTER — Telehealth: Payer: Self-pay | Admitting: *Deleted

## 2023-02-22 NOTE — Telephone Encounter (Signed)
-----   Message from Rip Harbour, [facility] sent at 02/17/2023  3:53 PM EST -----  ----- Message ----- From: Cedric Fishman Sent: 02/16/2023   3:55 PM EST To: Rip Harbour, [facility]  Needs to be in boot and not tears.

## 2023-02-22 NOTE — Telephone Encounter (Signed)
Is there anything else that to tell patient other than no tears, says that she has been in boot for a while-clarification on this message.

## 2023-02-22 NOTE — Telephone Encounter (Signed)
She needs to be in the boot for at least 6 weeks and then have her follow up with him

## 2023-02-22 NOTE — Telephone Encounter (Signed)
Called patient , no answer, left voice message of no tears and she needs to be in a boot.

## 2023-02-23 NOTE — Telephone Encounter (Signed)
Patient said that she has already been in boot for 6 weeks,will call back to schedule a f/u appointment

## 2023-04-19 ENCOUNTER — Other Ambulatory Visit: Payer: Self-pay | Admitting: Physician Assistant

## 2023-04-25 ENCOUNTER — Encounter: Payer: Self-pay | Admitting: Family Medicine

## 2023-04-28 NOTE — Telephone Encounter (Signed)
Thanks for the update

## 2024-10-03 ENCOUNTER — Ambulatory Visit (INDEPENDENT_AMBULATORY_CARE_PROVIDER_SITE_OTHER): Admitting: Internal Medicine

## 2024-10-03 ENCOUNTER — Encounter: Payer: Self-pay | Admitting: Internal Medicine

## 2024-10-03 VITALS — BP 100/76 | HR 70 | Temp 98.1°F | Ht 61.5 in | Wt 118.8 lb

## 2024-10-03 DIAGNOSIS — H9313 Tinnitus, bilateral: Secondary | ICD-10-CM

## 2024-10-03 DIAGNOSIS — H6122 Impacted cerumen, left ear: Secondary | ICD-10-CM

## 2024-10-03 NOTE — Progress Notes (Signed)
 Chief Complaint  Patient presents with   Ear Fullness    Pt reports she was at health fair last week- was about to do hearing test on L ear. Was unable to proceed due to wax in ear. Pt is here for ear wax removed. Pt reports she has  a lot of ringing on L ear over the year.     HPI: Desiree Bauer 53 y.o. come in for above   went for screening  but too much wax in left ear .  Under care integrative rx for thyroid  diseaes and stable on cytomel and synthroid . MHA suppressed on topamax . Ringing ears for years  uses ear bud in left ear but no q tips pain . No vertigo   Take s alprazolam  1/2 at night for sleep for years .  And prn ocass   ROS: See pertinent positives and negatives per HPI. Last visit here was 2021 but has been doing well    ssaw dr Johnny 2023 for rash and autoimmune eval skin lesion with bx prob eczema  Past Medical History:  Diagnosis Date   Acute pulmonary embolism (HCC) 06/09/2014   Allergy    Anxiety    Chronic kidney disease    kidney stone in left kidney    Chronic migraine    Depression    Hashimoto's disease 10/26/2016   Hypothyroidism    Ruptured ovarian cyst    SHINGLES 09/26/2009   Qualifier: Diagnosis of  By: Johnny MD, Garnette LABOR     Family History  Problem Relation Age of Onset   Multiple sclerosis Mother    Endometrial cancer Mother    Thyroid  disease Mother    Thyroid  disease Father    Hypertension Father    Stroke Maternal Grandmother    Rheum arthritis Maternal Grandmother    Diabetes Paternal Uncle    Colon cancer Neg Hx    Throat cancer Neg Hx    Heart disease Neg Hx    Liver disease Neg Hx    Kidney disease Neg Hx     Social History   Socioeconomic History   Marital status: Divorced    Spouse name: Not on file   Number of children: Not on file   Years of education: Not on file   Highest education level: Bachelor's degree (e.g., BA, AB, BS)  Occupational History   Occupation: Sales  Tobacco Use   Smoking status:  Never   Smokeless tobacco: Never  Vaping Use   Vaping status: Never Used  Substance and Sexual Activity   Alcohol use: Yes    Comment: 1-2 drinks daily   Drug use: No   Sexual activity: Yes    Comment: Ablation  Other Topics Concern   Not on file  Social History Narrative   hh of 6 temporary  2 children divorced  To be remarried in fall 18    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[employer]    g2 p2     Social Drivers of Health   Financial Resource Strain: Medium Risk (09/29/2024)   Overall Financial Resource Strain (CARDIA)    Difficulty of Paying Living Expenses: Somewhat hard  Food Insecurity: Food Insecurity Present (09/29/2024)   Hunger Vital Sign    Worried About Running Out of Food in the Last Year: Sometimes true    Ran Out of Food in the Last Year: Never true  Transportation Needs: No Transportation Needs (09/29/2024)   PRAPARE - Administrator, Civil Service (Medical): No  Lack of Transportation (Non-Medical): No  Physical Activity: Insufficiently Active (09/29/2024)   Exercise Vital Sign    Days of Exercise per Week: 2 days    Minutes of Exercise per Session: 40 min  Stress: Stress Concern Present (09/29/2024)   [employer] of Occupational Health - Occupational Stress Questionnaire    Feeling of Stress: To some extent  Social Connections: Moderately Integrated (09/29/2024)   Social Connection and Isolation Panel    Frequency of Communication with Friends and Family: More than three times a week    Frequency of Social Gatherings with Friends and Family: Once a week    Attends Religious Services: More than 4 times per year    Active Member of [employer] or Organizations: Yes    Attends Engineer, structural: More than 4 times per year    Marital Status: Divorced    Outpatient Medications Prior to Visit  Medication Sig Dispense Refill   ALPRAZolam  (XANAX ) 1 MG tablet Take 1 mg by mouth at bedtime.     COLLAGEN PO Take by mouth.     Cyanocobalamin (B-12 PO) Take by  mouth.     latanoprost (XALATAN) 0.005 % ophthalmic solution Place 1 drop into both eyes at bedtime.     levothyroxine  (SYNTHROID ) 75 MCG tablet TAKE 1 TABLET BY MOUTH EVERY DAY IN THE MORNING ON AN EMPTY STOMACH     liothyronine (CYTOMEL) 5 MCG tablet Take 5 mcg by mouth 2 (two) times daily.     MAGNESIUM OXIDE PO Take by mouth.     topiramate  (TOPAMAX ) 50 MG tablet      augmented betamethasone  dipropionate (DIPROLENE -AF) 0.05 % cream Apply topically 2 (two) times daily. 50 g 0   cyclobenzaprine  (FLEXERIL ) 10 MG tablet Take 0.5-1 tablets (5-10 mg total) by mouth 3 (three) times daily as needed. 30 tablet 0   indomethacin  (INDOCIN ) 50 MG capsule Take 1 capsule (50 mg total) by mouth 2 (two) times daily with a meal. 60 capsule 3   ketoconazole  (NIZORAL ) 2 % cream Apply 1 Application topically 2 (two) times daily. 15 g 0   Multiple Vitamins-Minerals (MULTI FOR HER) TABS See admin instructions.     triamcinolone  cream (KENALOG ) 0.1 % Apply 1 Application topically 2 (two) times daily. 453.6 g 2   Turmeric 400 MG CAPS See admin instructions.     VITAMIN D PO Take by mouth.     No facility-administered medications prior to visit.     EXAM:  BP 100/76 (BP Location: Left Arm, Patient Position: Sitting, Cuff Size: Normal)   Pulse 70   Temp 98.1 F (36.7 C) (Oral)   Ht 5' 1.5 (1.562 m)   Wt 118 lb 12.8 oz (53.9 kg)   LMP 09/13/2015   SpO2 97%   BMI 22.08 kg/m   Body mass index is 22.08 kg/m.  GENERAL: vitals reviewed and listed above, alert, oriented, appears well hydrated and in no acute distress HEENT: atraumatic, conjunctiva  clear, no obvious abnormalities on inspection of external nose and ears r eac nl tm clear  left eac 3+ brown wax   slit opening  no redness or swelling  Wax removal  After irrigation   eac clear and tm intact   NECK: no obvious masses on inspection palpation  MS: moves all extremities without noticeable focal  abnormality PSYCH: pleasant and cooperative, no  obvious depression or anxiety Neuro grossly normal   BP Readings from Last 3 Encounters:  10/03/24 100/76  10/22/22 110/64  10/19/22  110/78    ASSESSMENT AND PLAN:  Discussed the following assessment and plan:  Impacted cerumen of left ear  Tinnitus of both ears Advise   getting audiology eval at convenience .  Info on [employer] given  Hearing is grossly normal  Disc home care  prevnetion    Expectant management.  -Patient advised to return or notify health care team  if  new concerns arise.  There are no Patient Instructions on file for this visit.   Ceniyah Thorp K. Jamyia Fortune M.D.
